# Patient Record
Sex: Male | Born: 1960 | Race: White | Hispanic: No | Marital: Single | Smoking: Never smoker
Health system: Southern US, Community
[De-identification: ages and names within clinical notes are randomized; demographics above are authoritative.]

## PROBLEM LIST (undated history)

## (undated) DIAGNOSIS — F840 Autistic disorder: Secondary | ICD-10-CM

## (undated) DIAGNOSIS — N189 Chronic kidney disease, unspecified: Secondary | ICD-10-CM

## (undated) HISTORY — PX: OTHER SURGICAL HISTORY: SHX169

---

## 1998-09-25 ENCOUNTER — Emergency Department (HOSPITAL_COMMUNITY): Admission: EM | Admit: 1998-09-25 | Discharge: 1998-09-25 | Payer: Self-pay | Admitting: Emergency Medicine

## 2003-11-27 ENCOUNTER — Emergency Department (HOSPITAL_COMMUNITY): Admission: EM | Admit: 2003-11-27 | Discharge: 2003-11-27 | Payer: Self-pay | Admitting: Emergency Medicine

## 2003-11-28 ENCOUNTER — Emergency Department (HOSPITAL_COMMUNITY): Admission: EM | Admit: 2003-11-28 | Discharge: 2003-11-28 | Payer: Self-pay | Admitting: Emergency Medicine

## 2011-07-11 ENCOUNTER — Ambulatory Visit (HOSPITAL_COMMUNITY)
Admission: EM | Admit: 2011-07-11 | Discharge: 2011-07-12 | Disposition: A | Discharge status: Home / Self Care | Payer: Medicare Other | Attending: Urology | Admitting: Urology

## 2011-07-11 ENCOUNTER — Encounter: Payer: Self-pay | Admitting: Emergency Medicine

## 2011-07-11 DIAGNOSIS — I10 Essential (primary) hypertension: Secondary | ICD-10-CM | POA: Insufficient documentation

## 2011-07-11 DIAGNOSIS — Z79899 Other long term (current) drug therapy: Secondary | ICD-10-CM | POA: Insufficient documentation

## 2011-07-11 DIAGNOSIS — N200 Calculus of kidney: Secondary | ICD-10-CM | POA: Insufficient documentation

## 2011-07-11 DIAGNOSIS — R109 Unspecified abdominal pain: Secondary | ICD-10-CM | POA: Insufficient documentation

## 2011-07-11 DIAGNOSIS — E669 Obesity, unspecified: Secondary | ICD-10-CM | POA: Insufficient documentation

## 2011-07-11 DIAGNOSIS — N201 Calculus of ureter: Secondary | ICD-10-CM | POA: Insufficient documentation

## 2011-07-11 DIAGNOSIS — E119 Type 2 diabetes mellitus without complications: Secondary | ICD-10-CM | POA: Insufficient documentation

## 2011-07-11 DIAGNOSIS — F840 Autistic disorder: Secondary | ICD-10-CM | POA: Insufficient documentation

## 2011-07-11 HISTORY — DX: Autistic disorder: F84.0

## 2011-07-11 HISTORY — DX: Chronic kidney disease, unspecified: N18.9

## 2011-07-11 NOTE — ED Notes (Signed)
Pt alert, presents with family, non verbal by hx, c/o right flank pain, emesis X 1 yesterday

## 2011-07-12 ENCOUNTER — Encounter (HOSPITAL_COMMUNITY): Payer: Self-pay | Admitting: Anesthesiology

## 2011-07-12 ENCOUNTER — Other Ambulatory Visit: Payer: Self-pay

## 2011-07-12 ENCOUNTER — Encounter (HOSPITAL_COMMUNITY)
Admission: EM | Disposition: A | Discharge status: Home / Self Care | Payer: Self-pay | Source: Home / Self Care | Attending: Emergency Medicine

## 2011-07-12 ENCOUNTER — Encounter (HOSPITAL_COMMUNITY): Payer: Self-pay | Admitting: Emergency Medicine

## 2011-07-12 ENCOUNTER — Observation Stay (HOSPITAL_COMMUNITY): Payer: Medicare Other | Admitting: Anesthesiology

## 2011-07-12 ENCOUNTER — Emergency Department (HOSPITAL_COMMUNITY): Payer: Medicare Other

## 2011-07-12 HISTORY — PX: CYSTOSCOPY W/ URETERAL STENT PLACEMENT: SHX1429

## 2011-07-12 LAB — SURGICAL PCR SCREEN
MRSA, PCR: NEGATIVE
Staphylococcus aureus: POSITIVE — AB

## 2011-07-12 LAB — URINALYSIS, ROUTINE W REFLEX MICROSCOPIC
Bilirubin Urine: NEGATIVE
Glucose, UA: >1000 mg/dL — AB
Ketones, ur: 40 mg/dL — AB
Leukocytes, UA: NEGATIVE
Nitrite: NEGATIVE
Protein, ur: 100 mg/dL — AB
Specific Gravity, Urine: 1.025 (ref 1.005–1.030)
Urobilinogen, UA: 0.2 mg/dL (ref 0.0–1.0)
pH: 5.5 (ref 5.0–8.0)

## 2011-07-12 LAB — URINE MICROSCOPIC-ADD ON

## 2011-07-12 LAB — DIFFERENTIAL
Basophils Absolute: 0 10*3/uL (ref 0.0–0.1)
Basophils Relative: 0 % (ref 0–1)
Eosinophils Absolute: 0 10*3/uL (ref 0.0–0.7)
Eosinophils Relative: 0 % (ref 0–5)
Lymphocytes Relative: 17 % (ref 12–46)
Lymphs Abs: 2.7 10*3/uL (ref 0.7–4.0)
Monocytes Absolute: 1.3 10*3/uL — ABNORMAL HIGH (ref 0.1–1.0)
Monocytes Relative: 8 % (ref 3–12)
Neutro Abs: 12.1 10*3/uL — ABNORMAL HIGH (ref 1.7–7.7)
Neutrophils Relative %: 75 % (ref 43–77)

## 2011-07-12 LAB — POCT I-STAT, CHEM 8
BUN: 18 mg/dL (ref 6–23)
Calcium, Ion: 1.18 mmol/L (ref 1.12–1.32)
Chloride: 105 mEq/L (ref 96–112)
Creatinine, Ser: 1.5 mg/dL — ABNORMAL HIGH (ref 0.50–1.35)
Glucose, Bld: 183 mg/dL — ABNORMAL HIGH (ref 70–99)
HCT: 46 % (ref 39.0–52.0)
Hemoglobin: 15.6 g/dL (ref 13.0–17.0)
Potassium: 3.9 mEq/L (ref 3.5–5.1)
Sodium: 138 mEq/L (ref 135–145)
TCO2: 23 mmol/L (ref 0–100)

## 2011-07-12 LAB — CBC
HCT: 44.6 % (ref 39.0–52.0)
Hemoglobin: 15.1 g/dL (ref 13.0–17.0)
MCH: 29.5 pg (ref 26.0–34.0)
MCHC: 33.9 g/dL (ref 30.0–36.0)
MCV: 87.1 fL (ref 78.0–100.0)
Platelets: 335 10*3/uL (ref 150–400)
RBC: 5.12 MIL/uL (ref 4.22–5.81)
RDW: 13.8 % (ref 11.5–15.5)
WBC: 16.2 10*3/uL — ABNORMAL HIGH (ref 4.0–10.5)

## 2011-07-12 LAB — GLUCOSE, CAPILLARY
Glucose-Capillary: 169 mg/dL — ABNORMAL HIGH (ref 70–99)
Glucose-Capillary: 167 mg/dL — ABNORMAL HIGH (ref 70–99)
Glucose-Capillary: 183 mg/dL — ABNORMAL HIGH (ref 70–99)

## 2011-07-12 SURGERY — CYSTOSCOPY, WITH RETROGRADE PYELOGRAM AND URETERAL STENT INSERTION
Anesthesia: General | Site: Ureter | Laterality: Right | Wound class: Clean Contaminated

## 2011-07-12 MED ORDER — BELLADONNA ALKALOIDS-OPIUM 16.2-60 MG RE SUPP
RECTAL | Status: AC
  Filled 2011-07-12: qty 1

## 2011-07-12 MED ORDER — IOHEXOL 300 MG/ML  SOLN
INTRAMUSCULAR | Status: AC
  Filled 2011-07-12: qty 1

## 2011-07-12 MED ORDER — FENTANYL CITRATE 0.05 MG/ML IJ SOLN
50.0000 ug | Freq: Once | INTRAMUSCULAR | Status: AC
  Administered 2011-07-12: 50 ug via INTRAVENOUS
  Filled 2011-07-12: qty 2

## 2011-07-12 MED ORDER — ONDANSETRON HCL 4 MG/2ML IJ SOLN: 4.0000 mg | INTRAMUSCULAR | Status: DC | PRN

## 2011-07-12 MED ORDER — SODIUM CHLORIDE 0.45 % IV SOLN
INTRAVENOUS | Status: DC
  Administered 2011-07-12: 11:00:00 via INTRAVENOUS
  Administered 2011-07-12: 800 mL via INTRAVENOUS

## 2011-07-12 MED ORDER — ZOLPIDEM TARTRATE 5 MG PO TABS: 5.0000 mg | ORAL_TABLET | Freq: Every evening | ORAL | Status: DC | PRN

## 2011-07-12 MED ORDER — DIPHENHYDRAMINE HCL 12.5 MG/5ML PO ELIX: 12.5000 mg | ORAL_SOLUTION | Freq: Four times a day (QID) | ORAL | Status: DC | PRN

## 2011-07-12 MED ORDER — SODIUM CHLORIDE 0.9 % IR SOLN
Status: DC | PRN
  Administered 2011-07-12: 3000 mL

## 2011-07-12 MED ORDER — LIDOCAINE HCL (CARDIAC) 10 MG/ML IV SOLN
INTRAVENOUS | Status: DC | PRN
  Administered 2011-07-12: 75 mg via INTRAVENOUS

## 2011-07-12 MED ORDER — IOHEXOL 300 MG/ML  SOLN
INTRAMUSCULAR | Status: DC | PRN
  Administered 2011-07-12: 7 mL

## 2011-07-12 MED ORDER — KETOROLAC TROMETHAMINE 30 MG/ML IJ SOLN
30.0000 mg | Freq: Once | INTRAMUSCULAR | Status: AC
  Administered 2011-07-12: 30 mg via INTRAVENOUS
  Filled 2011-07-12: qty 1

## 2011-07-12 MED ORDER — BELLADONNA ALKALOIDS-OPIUM 16.2-60 MG RE SUPP
RECTAL | Status: DC | PRN
  Administered 2011-07-12: 1 via RECTAL

## 2011-07-12 MED ORDER — INSULIN ASPART 100 UNIT/ML ~~LOC~~ SOLN: 0.0000 [IU] | SUBCUTANEOUS | Status: DC

## 2011-07-12 MED ORDER — ONDANSETRON HCL 4 MG/2ML IJ SOLN
INTRAMUSCULAR | Status: DC | PRN
  Administered 2011-07-12: 4 mg via INTRAVENOUS

## 2011-07-12 MED ORDER — SODIUM CHLORIDE 0.9 % IV BOLUS (SEPSIS)
1000.0000 mL | Freq: Once | INTRAVENOUS | Status: AC
  Administered 2011-07-12: 1000 mL via INTRAVENOUS

## 2011-07-12 MED ORDER — PROPOFOL 10 MG/ML IV BOLUS
INTRAVENOUS | Status: DC | PRN
  Administered 2011-07-12: 200 mg via INTRAVENOUS

## 2011-07-12 MED ORDER — HYDROMORPHONE HCL PF 1 MG/ML IJ SOLN: 0.5000 mg | INTRAMUSCULAR | Status: DC | PRN

## 2011-07-12 MED ORDER — DIPHENHYDRAMINE HCL 50 MG/ML IJ SOLN: 12.5000 mg | Freq: Four times a day (QID) | INTRAMUSCULAR | Status: DC | PRN

## 2011-07-12 MED ORDER — MIDAZOLAM HCL 5 MG/5ML IJ SOLN
INTRAMUSCULAR | Status: DC | PRN
  Administered 2011-07-12: 2 mg via INTRAVENOUS

## 2011-07-12 MED ORDER — HYDROCODONE-ACETAMINOPHEN 5-300 MG PO TABS: 1.0000 | ORAL_TABLET | Freq: Four times a day (QID) | ORAL | Status: DC

## 2011-07-12 MED ORDER — INSULIN ASPART 100 UNIT/ML ~~LOC~~ SOLN
SUBCUTANEOUS | Status: AC
  Administered 2011-07-12: 2 [IU]
  Filled 2011-07-12: qty 3

## 2011-07-12 MED ORDER — ONDANSETRON HCL 4 MG PO TABS: 4.0000 mg | ORAL_TABLET | Freq: Three times a day (TID) | ORAL | Status: DC | PRN

## 2011-07-12 MED ORDER — ONDANSETRON HCL 4 MG/2ML IJ SOLN
4.0000 mg | Freq: Once | INTRAMUSCULAR | Status: AC
  Administered 2011-07-12: 4 mg via INTRAVENOUS
  Filled 2011-07-12: qty 2

## 2011-07-12 MED ORDER — CIPROFLOXACIN IN D5W 400 MG/200ML IV SOLN
400.0000 mg | INTRAVENOUS | Status: AC
  Administered 2011-07-12: 400 mg via INTRAVENOUS
  Filled 2011-07-12: qty 200

## 2011-07-12 MED ORDER — FENTANYL CITRATE 0.05 MG/ML IJ SOLN
INTRAMUSCULAR | Status: DC | PRN
  Administered 2011-07-12: 100 ug via INTRAVENOUS
  Administered 2011-07-12 (×3): 50 ug via INTRAVENOUS

## 2011-07-12 MED ORDER — 0.9 % SODIUM CHLORIDE (POUR BTL) OPTIME
TOPICAL | Status: DC | PRN
  Administered 2011-07-12: 1000 mL

## 2011-07-12 SURGICAL SUPPLY — 16 items
ADAPTER CATH URET PLST 4-6FR (CATHETERS) ×2 IMPLANT
BAG URO CATCHER STRL LF (DRAPE) ×2 IMPLANT
CATH INTERMIT  6FR 70CM (CATHETERS) ×2 IMPLANT
CLOTH BEACON ORANGE TIMEOUT ST (SAFETY) ×2 IMPLANT
DRAPE CAMERA CLOSED 9X96 (DRAPES) ×2 IMPLANT
GLOVE BIOGEL M STRL SZ7.5 (GLOVE) ×2 IMPLANT
GLOVE SURG SS PI 8.5 STRL IVOR (GLOVE) ×1
GLOVE SURG SS PI 8.5 STRL STRW (GLOVE) ×1 IMPLANT
GOWN STRL NON-REIN LRG LVL3 (GOWN DISPOSABLE) ×4 IMPLANT
GUIDEWIRE STR DUAL SENSOR (WIRE) ×2 IMPLANT
IV NS IRRIG 3000ML ARTHROMATIC (IV SOLUTION) ×2 IMPLANT
MANIFOLD NEPTUNE II (INSTRUMENTS) ×2 IMPLANT
NS IRRIG 1000ML POUR BTL (IV SOLUTION) ×2 IMPLANT
PACK CYSTO (CUSTOM PROCEDURE TRAY) ×2 IMPLANT
STENT CONTOUR 6FRX24X.038 (STENTS) ×2 IMPLANT
TUBING CONNECTING 10 (TUBING) ×2 IMPLANT

## 2011-07-12 NOTE — ED Notes (Signed)
Report called to Sheralyn Boatman, RN in short stay.  Pt. Transported to Short Stay via stretcher.  Stable.  Father at bedside.

## 2011-07-12 NOTE — Transfer of Care (Addendum)
Immediate Anesthesia Transfer of Care Note  Patient: Julian Reyes  Procedure(s) Performed:  CYSTOSCOPY WITH RETROGRADE PYELOGRAM/URETERAL STENT PLACEMENT  Patient Location: PACU  Anesthesia Type: General  Level of Consciousness: awake, alert , oriented and responds to stimulation  Airway & Oxygen Therapy: Patient Spontanous Breathing  Post-op Assessment: Report given to PACU RN and Post -op Vital signs reviewed and stable  Post vital signs: stable  Complications: No apparent anesthesia complications Pt autistic and trying to get u p to urinate , keeping monitors on and off O2 sat 97%

## 2011-07-12 NOTE — ED Notes (Signed)
CBG 167 

## 2011-07-12 NOTE — Op Note (Signed)
Preoperative diagnosis:  1. Right ureteral and renal calculus   Postoperative diagnosis:  1. Right ureteral and renal calculus   Procedure:  1. Cystoscopy 2. Right ureteral stent placement (6 x 24) 3. Right retrograde pyelography with interpretation   Surgeon: Moody Bruins. M.D.  Anesthesia: General  Complications: None  Intraoperative findings: Right retrograde pyelography demonstrates a filling defect in the proximal right ureter consistent with the patient's known stone as well as a filling defect in the right renal pelvis consistent with the patient's known 5 mm renal stone.  EBL: Minimal  Specimens: None  Indication: Julian Reyes is a 51 y.o. patient with a right ureteral stone. After reviewing the management options for treatment, he elected to proceed with the above surgical procedure(s). We have discussed the potential benefits and risks of the procedure, side effects of the proposed treatment, the likelihood of the patient achieving the goals of the procedure, and any potential problems that might occur during the procedure or recuperation. Informed consent has been obtained.  Description of procedure:  The patient was taken to the operating room and general anesthesia was induced.  The patient was placed in the dorsal lithotomy position, prepped and draped in the usual sterile fashion, and preoperative antibiotics were administered. A preoperative time-out was performed.   Cystourethroscopy was performed.  The patient's urethra was examined and was normal. The bladder was then systematically examined in its entirety. There was no evidence for any bladder tumors, stones, or other mucosal pathology.    Attention then turned to the right ureteral orifice and a ureteral catheter was used to intubate the ureteral orifice.  Omnipaque contrast was injected through the ureteral catheter and a retrograde pyelogram was performed with findings as dictated above.  A 0.38  sensor guidewire was then advanced up the right ureter into the renal pelvis under fluoroscopic guidance.  The wire was then backloaded through the cystoscope and a ureteral stent was advance over the wire using Seldinger technique.  The stent was positioned appropriately under fluoroscopic and cystoscopic guidance.  The wire was then removed with an adequate stent curl noted in the renal pelvis as well as in the bladder.  The bladder was then emptied and the procedure ended.  The patient appeared to tolerate the procedure well and without complications.  The patient was able to be awakened and transferred to the recovery unit in satisfactory condition.    Moody Bruins MD

## 2011-07-12 NOTE — ED Notes (Signed)
Father states pt has a hx of kidney infection and kidney stones

## 2011-07-12 NOTE — ED Notes (Signed)
Pt is complaining of right flank abdominal pain per his father. Father states pt wanted him to "rub his right side on his back." pt is in no apparent distress.

## 2011-07-12 NOTE — ED Notes (Signed)
Pt. Voided approx. 250 ml light yellow urine--no stones or sediment seen.

## 2011-07-12 NOTE — Progress Notes (Signed)
Patient received by wheelchair from PACU very agitated and removing IV dressing and clothing. RN went over discharge instruction with pts father. His mother is retired Engineer, civil (consulting) who use to work at [employer]. Family given urinal, urine strainer, and collection cup if they are able to obtain stone when pt urinates. Pt does not want RN to do another set of vital signs and swings out when RN tries to obtain them. Assisted with dressing and taken to front door. His father verbalizes understanding of instructions and new prescriptions. States his wife will know what to do.

## 2011-07-12 NOTE — Progress Notes (Signed)
11 am oriented to the room father with the patient,side rails  up x 2 Explained everything to patient and  Father no questions by father

## 2011-07-12 NOTE — Progress Notes (Signed)
1100 am called Dr. Vevelyn Royals office re; any additional order staff to have him call us back 1300 checked with Dr. Vevelyn Royals office again to have him call back after getting out of surgery

## 2011-07-12 NOTE — ED Provider Notes (Signed)
History     CSN: [id number]  Arrival date & time 07/11/11  2240   First MD Initiated Contact with Patient 07/12/11 0301      Chief Complaint  Patient presents with  . Flank Pain    RT side. Pt non-verbal.  autistic. father thinks may be kidney stone or infection.    (Consider location/radiation/quality/duration/timing/severity/associated sxs/prior treatment) Patient is a 51 y.o. male presenting with flank pain. The history is provided by a parent. The history is limited by the condition of the patient.  Flank Pain This is a recurrent problem. The current episode started 12 to 24 hours ago. Episode frequency: intermittently. The problem has not changed since onset.He has tried nothing for the symptoms. The treatment provided no relief.  Patient has a h/o of kidney stone.  Symptoms started Friday with dark urine on Friday then had n/v starting on 12/31.  Patient has not eaten in 3 days so was dry heaving.  Then on 1/2 began taking dad's hand and placing it on the right flank as if for him to rub to.    Past Medical History  Diagnosis Date  . Autism   . Renal disorder   . Diabetes mellitus     History reviewed. No pertinent past surgical history.  No family history on file.  History  Substance Use Topics  . Smoking status: Not on file  . Smokeless tobacco: Not on file  . Alcohol Use:       Review of Systems  Unable to perform ROS Genitourinary: Positive for flank pain.    Allergies  Penicillins  Home Medications   Current Outpatient Rx  Name Route Sig Dispense Refill  . AMLODIPINE BESYLATE 5 MG PO TABS Oral Take 5 mg by mouth daily.      . ATORVASTATIN CALCIUM 10 MG PO TABS Oral Take 10 mg by mouth daily.      Marland Kitchen PIOGLITAZONE HCL-METFORMIN HCL 15-500 MG PO TABS Oral Take 1 tablet by mouth 2 (two) times daily with a meal.      . SITAGLIPTIN PHOSPHATE 25 MG PO TABS Oral Take 25 mg by mouth daily.        BP 149/96  Pulse 68  Temp(Src) 98.7 F (37.1 C) (Oral)   Resp 20  Wt 240 lb (108.863 kg)  SpO2 97%  Physical Exam  Constitutional: He appears well-developed and well-nourished.  HENT:  Head: Normocephalic and atraumatic.  Mouth/Throat: Oropharynx is clear and moist. No oropharyngeal exudate.  Eyes: Conjunctivae are normal. Pupils are equal, round, and reactive to light.  Neck: Normal range of motion. Neck supple. No tracheal deviation present.  Cardiovascular: Normal rate and regular rhythm.   Pulmonary/Chest: Effort normal and breath sounds normal. He has no wheezes.  Abdominal: Soft. Bowel sounds are normal. There is no tenderness. There is no rebound and no guarding.  Musculoskeletal: Normal range of motion.  Neurological: He is alert.  Skin: Skin is warm and dry. He is not diaphoretic.  Psychiatric:       unable    ED Course  Procedures (including critical care time)  Labs Reviewed  URINALYSIS, ROUTINE W REFLEX MICROSCOPIC - Abnormal; Notable for the following:    Glucose, UA >1000 (*)    Hgb urine dipstick MODERATE (*)    Ketones, ur 40 (*)    Protein, ur 100 (*)    All other components within normal limits  URINE MICROSCOPIC-ADD ON - Abnormal; Notable for the following:    Bacteria, UA FEW (*)  All other components within normal limits  CBC - Abnormal; Notable for the following:    WBC 16.2 (*)    All other components within normal limits  DIFFERENTIAL - Abnormal; Notable for the following:    Neutro Abs 12.1 (*)    Monocytes Absolute 1.3 (*)    All other components within normal limits  GLUCOSE, CAPILLARY - Abnormal; Notable for the following:    Glucose-Capillary 169 (*)    All other components within normal limits  POCT I-STAT, CHEM 8 - Abnormal; Notable for the following:    Creatinine, Ser 1.50 (*)    Glucose, Bld 183 (*)    All other components within normal limits  POCT CBG MONITORING  I-STAT, CHEM 8   Ct Abdomen Pelvis Wo Contrast  07/12/2011  *RADIOLOGY REPORT*  Clinical Data: Right flank pain.  Nausea  and vomiting.  History of kidney infections and stones.  Red cells and white cells in urine.  CT ABDOMEN AND PELVIS WITHOUT CONTRAST  Technique:  Multidetector CT imaging of the abdomen and pelvis was performed following the standard protocol without intravenous contrast.  Comparison: 11/27/2003  Findings: The lung bases are clear.  10 mm stone in the right ureteropelvic junction with periureteral stranding, proximal pyelocaliectasis, and pararenal stranding consistent with moderate obstruction.  This is new since the previous study.  There is a second smaller stone in the right renal pelvis proximal to the obstructing stone and measuring about 5 mm diameter.  The distal right ureter is decompressed and no bladder stones are visualized. No pyelocaliectasis, ureterectasis, or stone on the left.  Low attenuation change throughout the liver consistent with fatty infiltration.  Slightly increased density in the gallbladder suggesting sludge.  No discrete stones visualized.  No gallbladder distension or wall thickening.  Spleen size is normal.  The pancreas, adrenal glands, abdominal aorta, retroperitoneal lymph nodes, stomach and small bowel are normal for unenhanced scan. No free fluid or free air in the abdomen.  Pelvis:  No free or loculated pelvic fluid collections.  Focal inflammatory stranding in the right pelvic fat appears to be extending down from the obstructing right ureteral stone and is probably related to inflammation or extravasation related to the stone. The bladder wall is not thickened.  No free or loculated pelvic fluid collections.  The appendix is normal.  No significant pelvic lymphadenopathy.  Degenerative disc disease at L4-5.  IMPRESSION: Moderately obstructing 10 mm stone in the right ureteropelvic junction.  There is a second 5 mm stone more proximally in the right renal pelvis.  Original Report Authenticated By: Marlon Pel, M.D.     No diagnosis found.    MDM  Call placed to  Dr. Laverle Patter who will see the patient        Audris Speaker K Estephan Gallardo-Rasch, MD 07/12/11 [phone]

## 2011-07-12 NOTE — Consult Note (Signed)
Urology Consult   Physician requesting consult: Dr. April Palumbo-Rasch  Reason for consult: Right ureteral stone  History of Present Illness: Julian Reyes is a 51 y.o. autistic, non-verbal, non-communicative male patient who is under the care of his father and lives at home with his father. Although he does not communicate in a formal way, he was able to let his father know that he was in significant pain with his right flank as the main location of his pain.  He had associated nausea/vomiting and anorexia, but no fever.  He has a history of stones and has passed his prior stone over 10 years ago.  He has never required surgical intervention for kidney stones.  He denies a history of voiding or storage urinary symptoms, hematuria, UTIs, STDs, GU malignancy/trauma/surgery.  Past Medical History  Diagnosis Date  . Autism   . Diabetes mellitus     History reviewed. No pertinent past surgical history.  Home Medications:  Medications Prior to Admission  Medication Dose Route Frequency Provider Last Rate Last Dose  . fentaNYL (SUBLIMAZE) injection 50 mcg  50 mcg Intravenous Once April K Palumbo-Rasch, MD   50 mcg at 07/12/11 0453  . ketorolac (TORADOL) 30 MG/ML injection 30 mg  30 mg Intravenous Once April K Palumbo-Rasch, MD   30 mg at 07/12/11 0450  . ondansetron (ZOFRAN) injection 4 mg  4 mg Intravenous Once April K Palumbo-Rasch, MD   4 mg at 07/12/11 0354  . sodium chloride 0.9 % bolus 1,000 mL  1,000 mL Intravenous Once April K Palumbo-Rasch, MD   1,000 mL at 07/12/11 0353   Medications Prior to Admission  Medication Sig Dispense Refill  . pioglitazone-metformin (ACTOPLUS MET) 15-500 MG per tablet Take 1 tablet by mouth 2 (two) times daily with a meal.          Current Hospital Medications: Scheduled Meds:   . fentaNYL  50 mcg Intravenous Once  . ketorolac  30 mg Intravenous Once  . ondansetron  4 mg Intravenous Once  . sodium chloride  1,000 mL Intravenous Once    Continuous Infusions:  PRN Meds:.  Allergies:  Allergies  Allergen Reactions  . Penicillins     History reviewed. No pertinent family history.  Social History:  does not have a smoking history on file. He does not have any smokeless tobacco history on file. His alcohol and drug histories not on file.  ROS: A complete review of systems was performed.  All systems are negative except for pertinent findings as noted.  Physical Exam:  Vital signs in last 24 hours: Temp:  [98 F (36.7 C)-98.7 F (37.1 C)] 98 F (36.7 C) (01/03 0352) Pulse Rate:  [68-69] 69  (01/03 0352) Resp:  [18-20] 18  (01/03 0352) BP: (133-149)/(75-96) 133/75 mmHg (01/03 0352) SpO2:  [97 %-100 %] 100 % (01/03 0352) Weight:  [108.863 kg (240 lb)] 240 lb (108.863 kg) (01/02 2320) General:  Alert and oriented, No acute distress HEENT: Normocephalic, atraumatic Neck: No JVD or lymphadenopathy Cardiovascular: Regular rate and rhythm Lungs: Clear bilaterally Abdomen: Soft, nontender, nondistended, no abdominal masses, obese Back: No CVA tenderness Extremities: No edema Neurologic: Grossly intact  Laboratory Data:  Results for orders placed during the hospital encounter of 07/11/11 (from the past 72 hour(s))  URINALYSIS, ROUTINE W REFLEX MICROSCOPIC     Status: Abnormal   Collection Time   07/11/11 11:25 PM      Component Value Range Comment   Color, Urine YELLOW  YELLOW  APPearance CLEAR  CLEAR     Specific Gravity, Urine 1.025  1.005 - 1.030     pH 5.5  5.0 - 8.0     Glucose, UA >1000 (*) NEGATIVE (mg/dL)    Hgb urine dipstick MODERATE (*) NEGATIVE     Bilirubin Urine NEGATIVE  NEGATIVE     Ketones, ur 40 (*) NEGATIVE (mg/dL)    Protein, ur 409 (*) NEGATIVE (mg/dL)    Urobilinogen, UA 0.2  0.0 - 1.0 (mg/dL)    Nitrite NEGATIVE  NEGATIVE     Leukocytes, UA NEGATIVE  NEGATIVE    URINE MICROSCOPIC-ADD ON     Status: Abnormal   Collection Time   07/11/11 11:25 PM      Component Value Range Comment    Squamous Epithelial / LPF RARE  RARE     WBC, UA 3-6  <3 (WBC/hpf)    RBC / HPF 3-6  <3 (RBC/hpf)    Bacteria, UA FEW (*) RARE     Urine-Other MUCOUS PRESENT     GLUCOSE, CAPILLARY     Status: Abnormal   Collection Time   07/12/11  2:58 AM      Component Value Range Comment   Glucose-Capillary 169 (*) 70 - 99 (mg/dL)    Comment 1 Documented in Chart      Comment 2 Notify RN     CBC     Status: Abnormal   Collection Time   07/12/11  3:10 AM      Component Value Range Comment   WBC 16.2 (*) 4.0 - 10.5 (K/uL)    RBC 5.12  4.22 - 5.81 (MIL/uL)    Hemoglobin 15.1  13.0 - 17.0 (g/dL)    HCT 81.1  91.4 - 78.2 (%)    MCV 87.1  78.0 - 100.0 (fL)    MCH 29.5  26.0 - 34.0 (pg)    MCHC 33.9  30.0 - 36.0 (g/dL)    RDW 95.6  21.3 - 08.6 (%)    Platelets 335  150 - 400 (K/uL)   DIFFERENTIAL     Status: Abnormal   Collection Time   07/12/11  3:10 AM      Component Value Range Comment   Neutrophils Relative 75  43 - 77 (%)    Neutro Abs 12.1 (*) 1.7 - 7.7 (K/uL)    Lymphocytes Relative 17  12 - 46 (%)    Lymphs Abs 2.7  0.7 - 4.0 (K/uL)    Monocytes Relative 8  3 - 12 (%)    Monocytes Absolute 1.3 (*) 0.1 - 1.0 (K/uL)    Eosinophils Relative 0  0 - 5 (%)    Eosinophils Absolute 0.0  0.0 - 0.7 (K/uL)    Basophils Relative 0  0 - 1 (%)    Basophils Absolute 0.0  0.0 - 0.1 (K/uL)   POCT I-STAT, CHEM 8     Status: Abnormal   Collection Time   07/12/11  3:22 AM      Component Value Range Comment   Sodium 138  135 - 145 (mEq/L)    Potassium 3.9  3.5 - 5.1 (mEq/L)    Chloride 105  96 - 112 (mEq/L)    BUN 18  6 - 23 (mg/dL)    Creatinine, Ser 5.78 (*) 0.50 - 1.35 (mg/dL)    Glucose, Bld 469 (*) 70 - 99 (mg/dL)    Calcium, Ion 6.29  1.12 - 1.32 (mmol/L)    TCO2 23  0 - 100 (  mmol/L)    Hemoglobin 15.6  13.0 - 17.0 (g/dL)    HCT 40.9  81.1 - 91.4 (%)    No results found for this or any previous visit (from the past 240 hour(s)). Creatinine:  Basename 07/12/11 0322  CREATININE 1.50*     Radiologic Imaging: Ct Abdomen Pelvis Wo Contrast  07/12/2011  *RADIOLOGY REPORT*  Clinical Data: Right flank pain.  Nausea and vomiting.  History of kidney infections and stones.  Red cells and white cells in urine.  CT ABDOMEN AND PELVIS WITHOUT CONTRAST  Technique:  Multidetector CT imaging of the abdomen and pelvis was performed following the standard protocol without intravenous contrast.  Comparison: 11/27/2003  Findings: The lung bases are clear.  10 mm stone in the right ureteropelvic junction with periureteral stranding, proximal pyelocaliectasis, and pararenal stranding consistent with moderate obstruction.  This is new since the previous study.  There is a second smaller stone in the right renal pelvis proximal to the obstructing stone and measuring about 5 mm diameter.  The distal right ureter is decompressed and no bladder stones are visualized. No pyelocaliectasis, ureterectasis, or stone on the left.  Low attenuation change throughout the liver consistent with fatty infiltration.  Slightly increased density in the gallbladder suggesting sludge.  No discrete stones visualized.  No gallbladder distension or wall thickening.  Spleen size is normal.  The pancreas, adrenal glands, abdominal aorta, retroperitoneal lymph nodes, stomach and small bowel are normal for unenhanced scan. No free fluid or free air in the abdomen.  Pelvis:  No free or loculated pelvic fluid collections.  Focal inflammatory stranding in the right pelvic fat appears to be extending down from the obstructing right ureteral stone and is probably related to inflammation or extravasation related to the stone. The bladder wall is not thickened.  No free or loculated pelvic fluid collections.  The appendix is normal.  No significant pelvic lymphadenopathy.  Degenerative disc disease at L4-5.  IMPRESSION: Moderately obstructing 10 mm stone in the right ureteropelvic junction.  There is a second 5 mm stone more proximally in the right  renal pelvis.  Original Report Authenticated By: Marlon Pel, M.D.    I independently reviewed the above imaging studies.  Impression/Assessment:  Right proximal ureteral stone (10 mm) and right renal calculus (5 mm)  Plan:  Due to the fact that his pain cannot be well assessed, I discussed options with the patient's father who is his primary caretaker and medical POA. We have reviewed options including PCNL, ureteroscopic therapy, and ESWL as options considering the size of stone and unlikely possibility of spontaneous stone passage.  Considering the inability to assess his pain, we have decided to proceed with intervention today and he will undergo cystoscopy and stent placement to relieve any significant pain.  I think he will be best served with ureteroscopic laser lithotripsy for definitive therapy considering that he likely will not do well with sedation with ESWL since he won't be able to communicate his pain and his stone is not large enough to warrant a more invasive PCNL.  His father is agreeable to proceed with a stent today.  I discussed the potential benefits and risks of the procedure, side effects of the proposed treatment, the likelihood of the patient achieving the goals of the procedure, and any potential problems that might occur during the procedure or recuperation.    Aidric Endicott,LES 07/12/2011, 6:17 AM    Moody Bruins MD

## 2011-07-12 NOTE — Anesthesia Preprocedure Evaluation (Signed)
Anesthesia Evaluation  Patient identified by MRN, date of birth, ID band Patient awake    Reviewed: Allergy & Precautions, H&P , NPO status , Patient's Chart, lab work & pertinent test results  Airway Mallampati: II TM Distance: >3 FB Neck ROM: Full    Dental No notable dental hx.    Pulmonary neg pulmonary ROS,  clear to auscultation  Pulmonary exam normal       Cardiovascular neg cardio ROS Regular Normal    Neuro/Psych PSYCHIATRIC DISORDERS Autism. Does not speak. Father with patient.Negative Neurological ROS  Negative Psych ROS   GI/Hepatic negative GI ROS, Neg liver ROS,   Endo/Other  Negative Endocrine ROSDiabetes mellitus-, Type 2, Oral Hypoglycemic Agents  Renal/GU negative Renal ROS  Genitourinary negative   Musculoskeletal negative musculoskeletal ROS (+)   Abdominal (+) obese,   Peds negative pediatric ROS (+)  Hematology negative hematology ROS (+)   Anesthesia Other Findings   Reproductive/Obstetrics negative OB ROS                           Anesthesia Physical Anesthesia Plan  ASA: III  Anesthesia Plan: General   Post-op Pain Management:    Induction: Intravenous  Airway Management Planned: LMA  Additional Equipment:   Intra-op Plan:   Post-operative Plan: Extubation in OR  Informed Consent: I have reviewed the patients History and Physical, chart, labs and discussed the procedure including the risks, benefits and alternatives for the proposed anesthesia with the patient or authorized representative who has indicated his/her understanding and acceptance.   Dental advisory given  Plan Discussed with: CRNA  Anesthesia Plan Comments:         Anesthesia Quick Evaluation

## 2011-07-12 NOTE — ED Notes (Signed)
Report received from p.m. Shift--Pt. Sitting on carrier--father was  at bedside but has- left for few minutes to get something to eat--Pt. Does not appear to be in any distress---breakfast tray served---tried assisting pt. With food tray but, he pushed the spoon and entire tray away----shaking his head to indicate "no."  IV patent

## 2011-07-12 NOTE — Discharge Summary (Signed)
Date of admission: 07/11/2011  Date of discharge: 07/12/2011  Admission diagnosis: Right ureteral stone  Discharge diagnosis: Same  Secondary diagnoses: Diabetes and HTN  History and Physical: For full details, please see admission history and physical. Briefly, Julian Reyes is a 51 y.o. year old patient with a right ureteral stone.   Hospital Course: He presented to the ED with pain and was found to have a 10 mm proximal right ureteral stone and a 5 mm renal pelvis.  We discussed options and the patient and his father (his medical POA) decided to proceed with a right ureteral stent for pain relief and to proceed with ureteroscopic laser lithotripsy at a later date for definitive treatment.  Laboratory values:  Basename 07/12/11 0322 07/12/11 0310  HGB 15.6 15.1  HCT 46.0 44.6    Basename 07/12/11 0322  CREATININE 1.50*    Disposition: Home  Discharge instruction: The patient was instructed to be ambulatory but told to refrain from heavy lifting, strenuous activity, or driving.    Discharge medications:  Medication List  As of 07/12/2011  3:15 PM   START taking these medications         Hydrocodone-Acetaminophen 5-300 MG Tabs   Take 1-2 tablets by mouth every 6 (six) hours.      ondansetron 4 MG tablet   Commonly known as: ZOFRAN   Take 1 tablet (4 mg total) by mouth every 8 (eight) hours as needed for nausea.         CONTINUE taking these medications         amLODipine 5 MG tablet   Commonly known as: NORVASC      atorvastatin 10 MG tablet   Commonly known as: LIPITOR      pioglitazone-metformin 15-500 MG per tablet   Commonly known as: ACTOPLUS MET      sitaGLIPtin 25 MG tablet   Commonly known as: JANUVIA          Where to get your medications    These are the prescriptions that you need to pick up.   You may get these medications from any pharmacy.         Hydrocodone-Acetaminophen 5-300 MG Tabs   ondansetron 4 MG tablet            Followup:    Follow-up Information    Please follow up. (Will call to arrange follow up surgery.)

## 2011-07-12 NOTE — Anesthesia Postprocedure Evaluation (Signed)
  Anesthesia Post-op Note  Patient: Julian Reyes  Procedure(s) Performed:  CYSTOSCOPY WITH RETROGRADE PYELOGRAM/URETERAL STENT PLACEMENT  Patient Location: PACU  Anesthesia Type: General  Level of Consciousness: awake and alert   Airway and Oxygen Therapy: Patient Spontanous Breathing  Post-op Pain: mild  Post-op Assessment: Post-op Vital signs reviewed, Patient's Cardiovascular Status Stable, Respiratory Function Stable, Patent Airway and No signs of Nausea or vomiting  Post-op Vital Signs: stable  Complications: No apparent anesthesia complications

## 2011-07-12 NOTE — ED Notes (Signed)
Pt CBG is 169, RN made aware.

## 2011-07-13 ENCOUNTER — Other Ambulatory Visit: Payer: Self-pay | Admitting: Urology

## 2011-07-13 ENCOUNTER — Encounter (HOSPITAL_COMMUNITY): Payer: Self-pay

## 2011-07-13 MED FILL — Mupirocin Oint 2%: CUTANEOUS | Qty: 22 | Status: AC

## 2011-07-16 ENCOUNTER — Encounter (HOSPITAL_COMMUNITY): Payer: Self-pay | Admitting: Urology

## 2011-07-18 ENCOUNTER — Encounter (HOSPITAL_COMMUNITY): Payer: Self-pay

## 2011-07-18 ENCOUNTER — Encounter (HOSPITAL_COMMUNITY)
Admission: RE | Admit: 2011-07-18 | Discharge: 2011-07-18 | Disposition: A | Discharge status: Home / Self Care | Payer: Medicare Other | Source: Ambulatory Visit | Attending: Urology | Admitting: Urology

## 2011-07-18 DIAGNOSIS — N189 Chronic kidney disease, unspecified: Secondary | ICD-10-CM

## 2011-07-18 DIAGNOSIS — F840 Autistic disorder: Secondary | ICD-10-CM

## 2011-07-18 HISTORY — DX: Chronic kidney disease, unspecified: N18.9

## 2011-07-18 HISTORY — DX: Autistic disorder: F84.0

## 2011-07-18 LAB — BASIC METABOLIC PANEL WITH GFR
BUN: 17 mg/dL (ref 6–23)
CO2: 26 meq/L (ref 19–32)
Calcium: 9.5 mg/dL (ref 8.4–10.5)
Chloride: 100 meq/L (ref 96–112)
Creatinine, Ser: 1.13 mg/dL (ref 0.50–1.35)
GFR calc Af Amer: 85 mL/min — ABNORMAL LOW
GFR calc non Af Amer: 73 mL/min — ABNORMAL LOW
Glucose, Bld: 216 mg/dL — ABNORMAL HIGH (ref 70–99)
Potassium: 3.9 meq/L (ref 3.5–5.1)
Sodium: 137 meq/L (ref 135–145)

## 2011-07-18 LAB — SURGICAL PCR SCREEN
MRSA, PCR: NEGATIVE
Staphylococcus aureus: NEGATIVE

## 2011-07-18 NOTE — Patient Instructions (Signed)
[address]  07/18/2011   Your procedure is scheduled on: 07-19-11  Report to [facility] at 0900 AM.  Call this number if you have problems the morning of surgery: [phone]   Remember:   Do not eat food:After Midnight.  May have clear liquids:until Midnight .  Clear liquids include soda, tea, black coffee, apple or grape juice, broth.  Take these medicines the morning of surgery with A SIP OF WATER: Amlodipine, Atorvastatin   Do not wear jewelry, make-up or nail polish.  Do not wear lotions, powders, or perfumes. You may wear deodorant.  Do not shave 48 hours prior to surgery.  Do not bring valuables to the hospital.  Contacts, dentures or bridgework may not be worn into surgery.  Leave suitcase in the car. After surgery it may be brought to your room.  For patients admitted to the hospital, checkout time is 11:00 AM the day of discharge.   Patients discharged the day of surgery will not be allowed to drive home.  Name and phone number of your driver: father  Special Instructions: CHG Shower Use Special Wash: 1/2 bottle night before surgery and 1/2 bottle morning of surgery.   Please read over the following fact sheets that you were given: MRSA Information

## 2011-07-18 NOTE — Pre-Procedure Instructions (Signed)
07-18-11 Chart to Short Stay -PCR screen pending at 1650.

## 2011-07-18 NOTE — H&P (Signed)
Urology Consult  Physician requesting consult: Dr. April Palumbo-Rasch  Reason for consult: Right ureteral stone  History of Present Illness: Julian Reyes is a 51 y.o. autistic, non-verbal, non-communicative male patient who is under the care of his father and lives at home with his father. Although he does not communicate in a formal way, he was able to let his father know that he was in significant pain with his right flank as the main location of his pain. He had associated nausea/vomiting and anorexia, but no fever. He has a history of stones and has passed his prior stone over 10 years ago. He has never required surgical intervention for kidney stones.  He denies a history of voiding or storage urinary symptoms, hematuria, UTIs, STDs, GU malignancy/trauma/surgery.  Past Medical History   Diagnosis  Date   .  Autism    .  Diabetes mellitus     History reviewed. No pertinent past surgical history.  Home Medications:  Medications Prior to Admission   Medication  Dose  Route  Frequency  Provider  Last Rate  Last Dose   .  fentaNYL (SUBLIMAZE) injection 50 mcg  50 mcg  Intravenous  Once  April K Palumbo-Rasch, MD   50 mcg at 07/12/11 0453   .  ketorolac (TORADOL) 30 MG/ML injection 30 mg  30 mg  Intravenous  Once  April K Palumbo-Rasch, MD   30 mg at 07/12/11 0450   .  ondansetron (ZOFRAN) injection 4 mg  4 mg  Intravenous  Once  April K Palumbo-Rasch, MD   4 mg at 07/12/11 0354   .  sodium chloride 0.9 % bolus 1,000 mL  1,000 mL  Intravenous  Once  April K Palumbo-Rasch, MD   1,000 mL at 07/12/11 0353    Medications Prior to Admission   Medication  Sig  Dispense  Refill   .  pioglitazone-metformin (ACTOPLUS MET) 15-500 MG per tablet  Take 1 tablet by mouth 2 (two) times daily with a meal.      Current Hospital Medications:  Scheduled Meds:  .  fentaNYL  50 mcg  Intravenous  Once   .  ketorolac  30 mg  Intravenous  Once   .  ondansetron  4 mg  Intravenous  Once   .  sodium chloride   1,000 mL  Intravenous  Once    Continuous Infusions:  PRN Meds:.  Allergies:  Allergies   Allergen  Reactions   .  Penicillins     History reviewed. No pertinent family history.  Social History: does not have a smoking history on file. He does not have any smokeless tobacco history on file. His alcohol and drug histories not on file.  ROS:  A complete review of systems was performed. All systems are negative except for pertinent findings as noted.  Physical Exam:  Vital signs in last 24 hours:  Temp: [98 F (36.7 C)-98.7 F (37.1 C)] 98 F (36.7 C) (01/03 0352)  Pulse Rate: [68-69] 69 (01/03 0352)  Resp: [18-20] 18 (01/03 0352)  BP: (133-149)/(75-96) 133/75 mmHg (01/03 0352)  SpO2: [97 %-100 %] 100 % (01/03 0352)  Weight: [108.863 kg (240 lb)] 240 lb (108.863 kg) (01/02 2320)  General: Alert and oriented, No acute distress  HEENT: Normocephalic, atraumatic  Neck: No JVD or lymphadenopathy  Cardiovascular: Regular rate and rhythm  Lungs: Clear bilaterally  Abdomen: Soft, nontender, nondistended, no abdominal masses, obese  Back: No CVA tenderness  Extremities: No edema  Neurologic:  Grossly intact  Laboratory Data:  Results for orders placed during the hospital encounter of 07/11/11 (from the past 72 hour(s))   URINALYSIS, ROUTINE W REFLEX MICROSCOPIC Status: Abnormal    Collection Time    07/11/11 11:25 PM   Component  Value  Range  Comment    Color, Urine  YELLOW  YELLOW     APPearance  CLEAR  CLEAR     Specific Gravity, Urine  1.025  1.005 - 1.030     pH  5.5  5.0 - 8.0     Glucose, UA  >1000 (*)  NEGATIVE (mg/dL)     Hgb urine dipstick  MODERATE (*)  NEGATIVE     Bilirubin Urine  NEGATIVE  NEGATIVE     Ketones, ur  40 (*)  NEGATIVE (mg/dL)     Protein, ur  161 (*)  NEGATIVE (mg/dL)     Urobilinogen, UA  0.2  0.0 - 1.0 (mg/dL)     Nitrite  NEGATIVE  NEGATIVE     Leukocytes, UA  NEGATIVE  NEGATIVE    URINE MICROSCOPIC-ADD ON Status: Abnormal    Collection Time     07/11/11 11:25 PM   Component  Value  Range  Comment    Squamous Epithelial / LPF  RARE  RARE     WBC, UA  3-6  <3 (WBC/hpf)     RBC / HPF  3-6  <3 (RBC/hpf)     Bacteria, UA  FEW (*)  RARE     Urine-Other  MUCOUS PRESENT     GLUCOSE, CAPILLARY Status: Abnormal    Collection Time    07/12/11 2:58 AM   Component  Value  Range  Comment    Glucose-Capillary  169 (*)  70 - 99 (mg/dL)     Comment 1  Documented in Chart      Comment 2  Notify RN     CBC Status: Abnormal    Collection Time    07/12/11 3:10 AM   Component  Value  Range  Comment    WBC  16.2 (*)  4.0 - 10.5 (K/uL)     RBC  5.12  4.22 - 5.81 (MIL/uL)     Hemoglobin  15.1  13.0 - 17.0 (g/dL)     HCT  09.6  04.5 - 52.0 (%)     MCV  87.1  78.0 - 100.0 (fL)     MCH  29.5  26.0 - 34.0 (pg)     MCHC  33.9  30.0 - 36.0 (g/dL)     RDW  40.9  81.1 - 15.5 (%)     Platelets  335  150 - 400 (K/uL)    DIFFERENTIAL Status: Abnormal    Collection Time    07/12/11 3:10 AM   Component  Value  Range  Comment    Neutrophils Relative  75  43 - 77 (%)     Neutro Abs  12.1 (*)  1.7 - 7.7 (K/uL)     Lymphocytes Relative  17  12 - 46 (%)     Lymphs Abs  2.7  0.7 - 4.0 (K/uL)     Monocytes Relative  8  3 - 12 (%)     Monocytes Absolute  1.3 (*)  0.1 - 1.0 (K/uL)     Eosinophils Relative  0  0 - 5 (%)     Eosinophils Absolute  0.0  0.0 - 0.7 (K/uL)     Basophils Relative  0  0 -  1 (%)     Basophils Absolute  0.0  0.0 - 0.1 (K/uL)    POCT I-STAT, CHEM 8 Status: Abnormal    Collection Time    07/12/11 3:22 AM   Component  Value  Range  Comment    Sodium  138  135 - 145 (mEq/L)     Potassium  3.9  3.5 - 5.1 (mEq/L)     Chloride  105  96 - 112 (mEq/L)     BUN  18  6 - 23 (mg/dL)     Creatinine, Ser  1.61 (*)  0.50 - 1.35 (mg/dL)     Glucose, Bld  096 (*)  70 - 99 (mg/dL)     Calcium, Ion  0.45  1.12 - 1.32 (mmol/L)     TCO2  23  0 - 100 (mmol/L)     Hemoglobin  15.6  13.0 - 17.0 (g/dL)     HCT  40.9  81.1 - 52.0 (%)     No results found for  this or any previous visit (from the past 240 hour(s)).  Creatinine:   Basename  07/12/11 0322   CREATININE  1.50*    Radiologic Imaging:  Ct Abdomen Pelvis Wo Contrast  07/12/2011 *RADIOLOGY REPORT* Clinical Data: Right flank pain. Nausea and vomiting. History of kidney infections and stones. Red cells and white cells in urine. CT ABDOMEN AND PELVIS WITHOUT CONTRAST Technique: Multidetector CT imaging of the abdomen and pelvis was performed following the standard protocol without intravenous contrast. Comparison: 11/27/2003 Findings: The lung bases are clear. 10 mm stone in the right ureteropelvic junction with periureteral stranding, proximal pyelocaliectasis, and pararenal stranding consistent with moderate obstruction. This is new since the previous study. There is a second smaller stone in the right renal pelvis proximal to the obstructing stone and measuring about 5 mm diameter. The distal right ureter is decompressed and no bladder stones are visualized. No pyelocaliectasis, ureterectasis, or stone on the left. Low attenuation change throughout the liver consistent with fatty infiltration. Slightly increased density in the gallbladder suggesting sludge. No discrete stones visualized. No gallbladder distension or wall thickening. Spleen size is normal. The pancreas, adrenal glands, abdominal aorta, retroperitoneal lymph nodes, stomach and small bowel are normal for unenhanced scan. No free fluid or free air in the abdomen. Pelvis: No free or loculated pelvic fluid collections. Focal inflammatory stranding in the right pelvic fat appears to be extending down from the obstructing right ureteral stone and is probably related to inflammation or extravasation related to the stone. The bladder wall is not thickened. No free or loculated pelvic fluid collections. The appendix is normal. No significant pelvic lymphadenopathy. Degenerative disc disease at L4-5. IMPRESSION: Moderately obstructing 10 mm stone in  the right ureteropelvic junction. There is a second 5 mm stone more proximally in the right renal pelvis. Original Report Authenticated By: Marlon Pel, M.D.   I independently reviewed the above imaging studies.  Impression/Assessment:  Right proximal ureteral stone (10 mm) and right renal calculus (5 mm)  Plan:  Due to the fact that his pain cannot be well assessed, I discussed options with the patient's father who is his primary caretaker and medical POA. We have reviewed options including PCNL, ureteroscopic therapy, and ESWL as options considering the size of stone and unlikely possibility of spontaneous stone passage. Considering the inability to assess his pain, we have decided to proceed with intervention today and he will undergo cystoscopy and stent placement to relieve any significant pain.  I think he will be best served with ureteroscopic laser lithotripsy for definitive therapy considering that he likely will not do well with sedation with ESWL since he won't be able to communicate his pain and his stone is not large enough to warrant a more invasive PCNL. His father is agreeable to proceed with a stent today.  I discussed the potential benefits and risks of the procedure, side effects of the proposed treatment, the likelihood of the patient achieving the goals of the procedure, and any potential problems that might occur during the procedure or recuperation.  Breanda Greenlaw,LES  07/12/2011, 6:17 AM  Rolly Salter, Montez Hageman. MD  Patient underwent ureteral stent placement last week.  He follows up today to undergo definitive ureteroscopic laser lithotripsy of his right ureteral and right renal stones.

## 2011-07-18 NOTE — Pre-Procedure Instructions (Addendum)
07-18-11 Pt. Is Autistic, doesn't speak, makes verbal noises only.Occ. Get agitated, but mostly cooperative. Father Ramces Shomaker is guardian,LPOA 07-18-11 CT ABD. With chest view (07-12-11) with chart-will be used.

## 2011-07-19 ENCOUNTER — Ambulatory Visit (HOSPITAL_COMMUNITY)
Admission: RE | Admit: 2011-07-19 | Discharge: 2011-07-19 | Disposition: A | Discharge status: Home / Self Care | Payer: Medicare Other | Source: Ambulatory Visit | Attending: Urology | Admitting: Urology

## 2011-07-19 ENCOUNTER — Encounter (HOSPITAL_COMMUNITY): Payer: Self-pay | Admitting: Anesthesiology

## 2011-07-19 ENCOUNTER — Encounter (HOSPITAL_COMMUNITY): Payer: Self-pay | Admitting: *Deleted

## 2011-07-19 ENCOUNTER — Ambulatory Visit (HOSPITAL_COMMUNITY): Payer: Medicare Other | Admitting: Anesthesiology

## 2011-07-19 ENCOUNTER — Encounter (HOSPITAL_COMMUNITY)
Admission: RE | Disposition: A | Discharge status: Home / Self Care | Payer: Self-pay | Source: Ambulatory Visit | Attending: Urology

## 2011-07-19 DIAGNOSIS — M5137 Other intervertebral disc degeneration, lumbosacral region: Secondary | ICD-10-CM | POA: Insufficient documentation

## 2011-07-19 DIAGNOSIS — M51379 Other intervertebral disc degeneration, lumbosacral region without mention of lumbar back pain or lower extremity pain: Secondary | ICD-10-CM | POA: Insufficient documentation

## 2011-07-19 DIAGNOSIS — Z79899 Other long term (current) drug therapy: Secondary | ICD-10-CM | POA: Insufficient documentation

## 2011-07-19 DIAGNOSIS — Z01812 Encounter for preprocedural laboratory examination: Secondary | ICD-10-CM | POA: Insufficient documentation

## 2011-07-19 DIAGNOSIS — N200 Calculus of kidney: Secondary | ICD-10-CM | POA: Insufficient documentation

## 2011-07-19 DIAGNOSIS — E119 Type 2 diabetes mellitus without complications: Secondary | ICD-10-CM | POA: Insufficient documentation

## 2011-07-19 DIAGNOSIS — R112 Nausea with vomiting, unspecified: Secondary | ICD-10-CM | POA: Insufficient documentation

## 2011-07-19 DIAGNOSIS — N201 Calculus of ureter: Secondary | ICD-10-CM | POA: Insufficient documentation

## 2011-07-19 HISTORY — PX: CYSTOSCOPY/RETROGRADE/URETEROSCOPY/STONE EXTRACTION WITH BASKET: SHX5317

## 2011-07-19 LAB — GLUCOSE, CAPILLARY
Glucose-Capillary: 171 mg/dL — ABNORMAL HIGH (ref 70–99)
Glucose-Capillary: 156 mg/dL — ABNORMAL HIGH (ref 70–99)

## 2011-07-19 SURGERY — CYSTOSCOPY, WITH CALCULUS REMOVAL USING BASKET
Anesthesia: General | Laterality: Right | Wound class: Clean Contaminated

## 2011-07-19 MED ORDER — PROPOFOL 10 MG/ML IV BOLUS
INTRAVENOUS | Status: DC | PRN
  Administered 2011-07-19: 150 mg via INTRAVENOUS
  Administered 2011-07-19: 20 mg via INTRAVENOUS

## 2011-07-19 MED ORDER — MIDAZOLAM HCL 5 MG/5ML IJ SOLN
INTRAMUSCULAR | Status: DC | PRN
  Administered 2011-07-19: 2 mg via INTRAVENOUS

## 2011-07-19 MED ORDER — ONDANSETRON HCL 4 MG/2ML IJ SOLN
INTRAMUSCULAR | Status: DC | PRN
  Administered 2011-07-19: 4 mg via INTRAVENOUS

## 2011-07-19 MED ORDER — CIPROFLOXACIN IN D5W 400 MG/200ML IV SOLN
INTRAVENOUS | Status: AC
  Filled 2011-07-19: qty 200

## 2011-07-19 MED ORDER — LACTATED RINGERS IV SOLN
INTRAVENOUS | Status: DC
  Administered 2011-07-19: 1000 mL via INTRAVENOUS

## 2011-07-19 MED ORDER — FENTANYL CITRATE 0.05 MG/ML IJ SOLN: 25.0000 ug | INTRAMUSCULAR | Status: DC | PRN

## 2011-07-19 MED ORDER — PROMETHAZINE HCL 25 MG/ML IJ SOLN: 6.2500 mg | INTRAMUSCULAR | Status: DC | PRN

## 2011-07-19 MED ORDER — IOHEXOL 300 MG/ML  SOLN
INTRAMUSCULAR | Status: AC
  Filled 2011-07-19: qty 1

## 2011-07-19 MED ORDER — SODIUM CHLORIDE 0.9 % IR SOLN
Status: DC | PRN
  Administered 2011-07-19: 3000 mL

## 2011-07-19 MED ORDER — FENTANYL CITRATE 0.05 MG/ML IJ SOLN
INTRAMUSCULAR | Status: DC | PRN
  Administered 2011-07-19: 100 ug via INTRAVENOUS
  Administered 2011-07-19 (×2): 50 ug via INTRAVENOUS
  Administered 2011-07-19: 100 ug via INTRAVENOUS
  Administered 2011-07-19: 50 ug via INTRAVENOUS

## 2011-07-19 MED ORDER — IOHEXOL 300 MG/ML  SOLN
INTRAMUSCULAR | Status: DC | PRN
  Administered 2011-07-19: 10 mL

## 2011-07-19 MED ORDER — CIPROFLOXACIN IN D5W 400 MG/200ML IV SOLN
400.0000 mg | INTRAVENOUS | Status: AC
  Administered 2011-07-19: 400 mg via INTRAVENOUS

## 2011-07-19 MED ORDER — CIPROFLOXACIN HCL 500 MG PO TABS: 500.0000 mg | ORAL_TABLET | Freq: Two times a day (BID) | ORAL | Status: AC

## 2011-07-19 SURGICAL SUPPLY — 15 items
ADAPTER CATH URET PLST 4-6FR (CATHETERS) IMPLANT
BAG URO CATCHER STRL LF (DRAPE) ×2 IMPLANT
BASKET ZERO TIP NITINOL 2.4FR (BASKET) ×2 IMPLANT
CATH INTERMIT  6FR 70CM (CATHETERS) ×2 IMPLANT
CLOTH BEACON ORANGE TIMEOUT ST (SAFETY) ×2 IMPLANT
DRAPE CAMERA CLOSED 9X96 (DRAPES) ×2 IMPLANT
GLOVE BIOGEL M STRL SZ7.5 (GLOVE) ×2 IMPLANT
GOWN PREVENTION PLUS XLARGE (GOWN DISPOSABLE) ×2 IMPLANT
GOWN STRL NON-REIN LRG LVL3 (GOWN DISPOSABLE) ×4 IMPLANT
GUIDEWIRE ANG ZIPWIRE 038X150 (WIRE) IMPLANT
GUIDEWIRE STR DUAL SENSOR (WIRE) ×2 IMPLANT
MANIFOLD NEPTUNE II (INSTRUMENTS) ×2 IMPLANT
PACK CYSTO (CUSTOM PROCEDURE TRAY) ×2 IMPLANT
SHEATH ACCESS URETERAL 38CM (SHEATH) ×2 IMPLANT
TUBING CONNECTING 10 (TUBING) ×2 IMPLANT

## 2011-07-19 NOTE — H&P (View-Only) (Signed)
Urology Consult   Physician requesting consult: Dr. April Palumbo-Rasch  Reason for consult: Right ureteral stone  History of Present Illness: Julian Reyes is a 50 y.o. autistic, non-verbal, non-communicative male patient who is under the care of his father and lives at home with his father. Although he does not communicate in a formal way, he was able to let his father know that he was in significant pain with his right flank as the main location of his pain.  He had associated nausea/vomiting and anorexia, but no fever.  He has a history of stones and has passed his prior stone over 10 years ago.  He has never required surgical intervention for kidney stones.  He denies a history of voiding or storage urinary symptoms, hematuria, UTIs, STDs, GU malignancy/trauma/surgery.  Past Medical History  Diagnosis Date  . Autism   . Diabetes mellitus     History reviewed. No pertinent past surgical history.  Home Medications:  Medications Prior to Admission  Medication Dose Route Frequency Provider Last Rate Last Dose  . fentaNYL (SUBLIMAZE) injection 50 mcg  50 mcg Intravenous Once April K Palumbo-Rasch, MD   50 mcg at 07/12/11 0453  . ketorolac (TORADOL) 30 MG/ML injection 30 mg  30 mg Intravenous Once April K Palumbo-Rasch, MD   30 mg at 07/12/11 0450  . ondansetron (ZOFRAN) injection 4 mg  4 mg Intravenous Once April K Palumbo-Rasch, MD   4 mg at 07/12/11 0354  . sodium chloride 0.9 % bolus 1,000 mL  1,000 mL Intravenous Once April K Palumbo-Rasch, MD   1,000 mL at 07/12/11 0353   Medications Prior to Admission  Medication Sig Dispense Refill  . pioglitazone-metformin (ACTOPLUS MET) 15-500 MG per tablet Take 1 tablet by mouth 2 (two) times daily with a meal.          Current Hospital Medications: Scheduled Meds:   . fentaNYL  50 mcg Intravenous Once  . ketorolac  30 mg Intravenous Once  . ondansetron  4 mg Intravenous Once  . sodium chloride  1,000 mL Intravenous Once    Continuous Infusions:  PRN Meds:.  Allergies:  Allergies  Allergen Reactions  . Penicillins     History reviewed. No pertinent family history.  Social History:  does not have a smoking history on file. He does not have any smokeless tobacco history on file. His alcohol and drug histories not on file.  ROS: A complete review of systems was performed.  All systems are negative except for pertinent findings as noted.  Physical Exam:  Vital signs in last 24 hours: Temp:  [98 F (36.7 C)-98.7 F (37.1 C)] 98 F (36.7 C) (01/03 0352) Pulse Rate:  [68-69] 69  (01/03 0352) Resp:  [18-20] 18  (01/03 0352) BP: (133-149)/(75-96) 133/75 mmHg (01/03 0352) SpO2:  [97 %-100 %] 100 % (01/03 0352) Weight:  [108.863 kg (240 lb)] 240 lb (108.863 kg) (01/02 2320) General:  Alert and oriented, No acute distress HEENT: Normocephalic, atraumatic Neck: No JVD or lymphadenopathy Cardiovascular: Regular rate and rhythm Lungs: Clear bilaterally Abdomen: Soft, nontender, nondistended, no abdominal masses, obese Back: No CVA tenderness Extremities: No edema Neurologic: Grossly intact  Laboratory Data:  Results for orders placed during the hospital encounter of 07/11/11 (from the past 72 hour(s))  URINALYSIS, ROUTINE W REFLEX MICROSCOPIC     Status: Abnormal   Collection Time   07/11/11 11:25 PM      Component Value Range Comment   Color, Urine YELLOW  YELLOW       APPearance CLEAR  CLEAR     Specific Gravity, Urine 1.025  1.005 - 1.030     pH 5.5  5.0 - 8.0     Glucose, UA >1000 (*) NEGATIVE (mg/dL)    Hgb urine dipstick MODERATE (*) NEGATIVE     Bilirubin Urine NEGATIVE  NEGATIVE     Ketones, ur 40 (*) NEGATIVE (mg/dL)    Protein, ur 100 (*) NEGATIVE (mg/dL)    Urobilinogen, UA 0.2  0.0 - 1.0 (mg/dL)    Nitrite NEGATIVE  NEGATIVE     Leukocytes, UA NEGATIVE  NEGATIVE    URINE MICROSCOPIC-ADD ON     Status: Abnormal   Collection Time   07/11/11 11:25 PM      Component Value Range Comment    Squamous Epithelial / LPF RARE  RARE     WBC, UA 3-6  <3 (WBC/hpf)    RBC / HPF 3-6  <3 (RBC/hpf)    Bacteria, UA FEW (*) RARE     Urine-Other MUCOUS PRESENT     GLUCOSE, CAPILLARY     Status: Abnormal   Collection Time   07/12/11  2:58 AM      Component Value Range Comment   Glucose-Capillary 169 (*) 70 - 99 (mg/dL)    Comment 1 Documented in Chart      Comment 2 Notify RN     CBC     Status: Abnormal   Collection Time   07/12/11  3:10 AM      Component Value Range Comment   WBC 16.2 (*) 4.0 - 10.5 (K/uL)    RBC 5.12  4.22 - 5.81 (MIL/uL)    Hemoglobin 15.1  13.0 - 17.0 (g/dL)    HCT 44.6  39.0 - 52.0 (%)    MCV 87.1  78.0 - 100.0 (fL)    MCH 29.5  26.0 - 34.0 (pg)    MCHC 33.9  30.0 - 36.0 (g/dL)    RDW 13.8  11.5 - 15.5 (%)    Platelets 335  150 - 400 (K/uL)   DIFFERENTIAL     Status: Abnormal   Collection Time   07/12/11  3:10 AM      Component Value Range Comment   Neutrophils Relative 75  43 - 77 (%)    Neutro Abs 12.1 (*) 1.7 - 7.7 (K/uL)    Lymphocytes Relative 17  12 - 46 (%)    Lymphs Abs 2.7  0.7 - 4.0 (K/uL)    Monocytes Relative 8  3 - 12 (%)    Monocytes Absolute 1.3 (*) 0.1 - 1.0 (K/uL)    Eosinophils Relative 0  0 - 5 (%)    Eosinophils Absolute 0.0  0.0 - 0.7 (K/uL)    Basophils Relative 0  0 - 1 (%)    Basophils Absolute 0.0  0.0 - 0.1 (K/uL)   POCT I-STAT, CHEM 8     Status: Abnormal   Collection Time   07/12/11  3:22 AM      Component Value Range Comment   Sodium 138  135 - 145 (mEq/L)    Potassium 3.9  3.5 - 5.1 (mEq/L)    Chloride 105  96 - 112 (mEq/L)    BUN 18  6 - 23 (mg/dL)    Creatinine, Ser 1.50 (*) 0.50 - 1.35 (mg/dL)    Glucose, Bld 183 (*) 70 - 99 (mg/dL)    Calcium, Ion 1.18  1.12 - 1.32 (mmol/L)    TCO2 23  0 - 100 (  mmol/L)    Hemoglobin 15.6  13.0 - 17.0 (g/dL)    HCT 46.0  39.0 - 52.0 (%)    No results found for this or any previous visit (from the past 240 hour(s)). Creatinine:  Basename 07/12/11 0322  CREATININE 1.50*     Radiologic Imaging: Ct Abdomen Pelvis Wo Contrast  07/12/2011  *RADIOLOGY REPORT*  Clinical Data: Right flank pain.  Nausea and vomiting.  History of kidney infections and stones.  Red cells and white cells in urine.  CT ABDOMEN AND PELVIS WITHOUT CONTRAST  Technique:  Multidetector CT imaging of the abdomen and pelvis was performed following the standard protocol without intravenous contrast.  Comparison: 11/27/2003  Findings: The lung bases are clear.  10 mm stone in the right ureteropelvic junction with periureteral stranding, proximal pyelocaliectasis, and pararenal stranding consistent with moderate obstruction.  This is new since the previous study.  There is a second smaller stone in the right renal pelvis proximal to the obstructing stone and measuring about 5 mm diameter.  The distal right ureter is decompressed and no bladder stones are visualized. No pyelocaliectasis, ureterectasis, or stone on the left.  Low attenuation change throughout the liver consistent with fatty infiltration.  Slightly increased density in the gallbladder suggesting sludge.  No discrete stones visualized.  No gallbladder distension or wall thickening.  Spleen size is normal.  The pancreas, adrenal glands, abdominal aorta, retroperitoneal lymph nodes, stomach and small bowel are normal for unenhanced scan. No free fluid or free air in the abdomen.  Pelvis:  No free or loculated pelvic fluid collections.  Focal inflammatory stranding in the right pelvic fat appears to be extending down from the obstructing right ureteral stone and is probably related to inflammation or extravasation related to the stone. The bladder wall is not thickened.  No free or loculated pelvic fluid collections.  The appendix is normal.  No significant pelvic lymphadenopathy.  Degenerative disc disease at L4-5.  IMPRESSION: Moderately obstructing 10 mm stone in the right ureteropelvic junction.  There is a second 5 mm stone more proximally in the right  renal pelvis.  Original Report Authenticated By: WILLIAM R. STEVENS, M.D.    I independently reviewed the above imaging studies.  Impression/Assessment:  Right proximal ureteral stone (10 mm) and right renal calculus (5 mm)  Plan:  Due to the fact that his pain cannot be well assessed, I discussed options with the patient's father who is his primary caretaker and medical POA. We have reviewed options including PCNL, ureteroscopic therapy, and ESWL as options considering the size of stone and unlikely possibility of spontaneous stone passage.  Considering the inability to assess his pain, we have decided to proceed with intervention today and he will undergo cystoscopy and stent placement to relieve any significant pain.  I think he will be best served with ureteroscopic laser lithotripsy for definitive therapy considering that he likely will not do well with sedation with ESWL since he won't be able to communicate his pain and his stone is not large enough to warrant a more invasive PCNL.  His father is agreeable to proceed with a stent today.  I discussed the potential benefits and risks of the procedure, side effects of the proposed treatment, the likelihood of the patient achieving the goals of the procedure, and any potential problems that might occur during the procedure or recuperation.    Jeanett Antonopoulos,LES 07/12/2011, 6:17 AM    Dawnna Gritz S. Kamil Mchaffie, Jr. MD  

## 2011-07-19 NOTE — Discharge Instructions (Signed)

## 2011-07-19 NOTE — Anesthesia Postprocedure Evaluation (Signed)
  Anesthesia Post-op Note  Patient: Julian Reyes  Procedure(s) Performed:  CYSTOSCOPY/RETROGRADE/URETEROSCOPY/STONE EXTRACTION WITH BASKET  Patient Location: PACU  Anesthesia Type: General  Level of Consciousness: awake and alert   Airway and Oxygen Therapy: Patient Spontanous Breathing  Post-op Pain: mild  Post-op Assessment: Post-op Vital signs reviewed, Patient's Cardiovascular Status Stable, Respiratory Function Stable, Patent Airway and No signs of Nausea or vomiting  Post-op Vital Signs: stable  Complications: No apparent anesthesia complications

## 2011-07-19 NOTE — Transfer of Care (Signed)
Immediate Anesthesia Transfer of Care Note  Patient: Julian Reyes  Procedure(s) Performed:  CYSTOSCOPY/RETROGRADE/URETEROSCOPY/STONE EXTRACTION WITH BASKET  Patient Location: PACU  Anesthesia Type: General  Level of Consciousness: awake, alert  and patient cooperative  Airway & Oxygen Therapy: Patient Spontanous Breathing and Patient connected to face mask oxygen  Post-op Assessment: Report given to PACU RN and Post -op Vital signs reviewed and stable  Post vital signs: Reviewed and stable Filed Vitals:   07/19/11 0857  BP: 160/97  Pulse: 103  Temp: 35.7 C  Resp: 16    Complications: No apparent anesthesia complications

## 2011-07-19 NOTE — Interval H&P Note (Signed)
History and Physical Interval Note:  07/19/2011 10:14 AM  Julian Reyes  has presented today for surgery, with the diagnosis of Right Ureteral and Right Renal Calculi  The various methods of treatment have been discussed with the patient and family. After consideration of risks, benefits and other options for treatment, the patient has consented to  Procedure(s): CYSTOSCOPY/RETROGRADE/URETEROSCOPY/STONE EXTRACTION WITH BASKET as a surgical intervention .  The patients' history has been reviewed, patient examined, no change in status, stable for surgery.  I have reviewed the patients' chart and labs.  Questions were answered to the patient's satisfaction.     Aloria Looper,LES

## 2011-07-19 NOTE — Progress Notes (Signed)
Returned to short stay .Father with pt. Pt singing quietly and co-operative

## 2011-07-19 NOTE — Op Note (Signed)
Preoperative diagnosis: Right ureteral and renal calculi  Postoperative diagnosis: Right ureteral and renal calculi  Procedure:  1. Cystoscopy 2. Right ureteroscopy and stone removal 3. Ureteroscopic laser lithotripsy 4. Right ureteral stent placement (6 x 24) 5. Right retrograde pyelography with interpretation  Surgeon: Moody Bruins. M.D.  Anesthesia: General  Complications: None  Intraoperative findings: Right retrograde pyelography demonstrated a filling defect within the right ureter and renal pelvis consistent with the patient's known calculus without other abnormalities noted.  EBL: Minimal  Specimens: 1. Right ureteral and renal calculi  Disposition of specimens: [facility] for stone analysis  Indication: Julian Reyes  is a 51 y.o. patient with urolithiasis. After reviewing the management options for treatment, he elected to proceed with the above surgical procedure(s). We have discussed the potential benefits and risks of the procedure, side effects of the proposed treatment, the likelihood of the patient achieving the goals of the procedure, and any potential problems that might occur during the procedure or recuperation. Informed consent has been obtained.  Description of procedure:  The patient was taken to the operating room and general anesthesia was induced.  The patient was placed in the dorsal lithotomy position, prepped and draped in the usual sterile fashion, and preoperative antibiotics were administered. A preoperative time-out was performed.   Cystourethroscopy was performed.  The patient's urethra was examined and was normal. The bladder was then systematically examined in its entirety. There was no evidence for any bladder tumors, stones, or other mucosal pathology.    Attention then turned to the right ureteral orifice and a ureteral catheter was used to intubate the ureteral orifice.  Omnipaque contrast was injected through the  ureteral catheter and a retrograde pyelogram was performed with findings as dictated above.  A 0.38 sensor guidewire was then advanced up the right ureter into the renal pelvis under fluoroscopic guidance.  A 12/14 Fr ureteral access sheath was then advance over the guide wire. The digital flexible ureteroscope was then advanced through the access sheath into the ureter next to the guidewire and the calculus was identified and was located in the renal pelvis having been pushed back out of the ureter.   The stone was then fragmented with the 200 micron holmium laser fiber on a setting of 0.8 J and frequency of 6 Hz.   All sizable stones were then removed with a zero tip nitinol basket.  Reinspection of the ureter/renal pelvis revealed no remaining visible stones or fragments of significant size.   The safety wire was then replaced and the access sheath removed.  The guidewire was backloaded through the cystoscope and a ureteral stent was advance over the wire using Seldinger technique.  The stent was positioned appropriately under fluoroscopic and cystoscopic guidance.  The wire was then removed with an adequate stent curl noted in the renal pelvis as well as in the bladder.  The bladder was then emptied and the procedure ended.  The patient appeared to tolerate the procedure well and without complications.  The patient was able to be awakened and transferred to the recovery unit in satisfactory condition.   Moody Bruins MD

## 2011-07-19 NOTE — Anesthesia Preprocedure Evaluation (Addendum)
Anesthesia Evaluation  Patient identified by MRN, date of birth, ID band Patient awake    Reviewed: Allergy & Precautions, H&P , NPO status , Patient's Chart, lab work & pertinent test results  Airway Mallampati: II TM Distance: >3 FB Neck ROM: Full    Dental No notable dental hx.    Pulmonary neg pulmonary ROS,  clear to auscultation  Pulmonary exam normal       Cardiovascular hypertension, Pt. on medications neg cardio ROS Regular Normal    Neuro/Psych PSYCHIATRIC DISORDERS Autistic. Does not speakNegative Neurological ROS  Negative Psych ROS   GI/Hepatic negative GI ROS, Neg liver ROS,   Endo/Other  Negative Endocrine ROSDiabetes mellitus-, Type 2, Oral Hypoglycemic Agents  Renal/GU negative Renal ROS  Genitourinary negative   Musculoskeletal negative musculoskeletal ROS (+)   Abdominal (+) obese,   Peds negative pediatric ROS (+)  Hematology negative hematology ROS (+)   Anesthesia Other Findings   Reproductive/Obstetrics negative OB ROS                          Anesthesia Physical Anesthesia Plan  ASA: III  Anesthesia Plan: General   Post-op Pain Management:    Induction: Intravenous  Airway Management Planned: LMA  Additional Equipment:   Intra-op Plan:   Post-operative Plan: Extubation in OR  Informed Consent: I have reviewed the patients History and Physical, chart, labs and discussed the procedure including the risks, benefits and alternatives for the proposed anesthesia with the patient or authorized representative who has indicated his/her understanding and acceptance.   Dental advisory given  Plan Discussed with: CRNA  Anesthesia Plan Comments: (Did fine with lma ga about one week ago.)        Anesthesia Quick Evaluation

## 2011-07-23 ENCOUNTER — Encounter (HOSPITAL_COMMUNITY): Payer: Self-pay | Admitting: Urology

## 2011-08-03 ENCOUNTER — Encounter (HOSPITAL_COMMUNITY): Payer: Self-pay | Admitting: *Deleted

## 2011-08-03 ENCOUNTER — Other Ambulatory Visit: Payer: Self-pay | Admitting: Urology

## 2011-08-04 NOTE — H&P (Signed)
History of Present Illness  Julian Reyes is a 51 year old with Autism and is non-verbal.  He has the following urologic history:  1) Urolithiasis: He has a history of urolithiasis.  Prior treatment:   May 2005: Saw Dr. Patsi Sears after passing a stone Jan 2012: R ureteroscopic laser lithotripsy  Interval history:  He follows up today for further evaluation after his right ureteroscopic laser lithotripsy procedure. He tolerated his procedure well and has no specific complaints today. His father states that he has not complained of any pain. He is scheduled to undergo stent removal today.   Past Medical History Problems  1. History of  Autistic Disorder 299.00 2. History of  Diabetes Mellitus 250.00 3. History of  Hypertension 401.9  Surgical History Problems  1. History of  Cystoscopy With Insertion Of Ureteral Stent Right 2. History of  Cystoscopy With Ureteroscopy With Removal Of Calculus Right 3. History of  Partial Permanent Excision Of Nail And Matrix Toes  Current Meds 1. Actoplus Met 15-850 MG Oral Tablet; Therapy: 15Aug2012 to 2. AmLODIPine Besylate 10 MG Oral Tablet; Therapy: 04Sep2012 to 3. Atorvastatin Calcium 40 MG Oral Tablet; Therapy: 04Sep2012 to 4. Ciprofloxacin HCl 500 MG Oral Tablet; Therapy: 10Jan2013 to 5. GlyBURIDE 5 MG Oral Tablet; Therapy: 01Jan2013 to 6. Januvia 100 MG Oral Tablet; Therapy: 04Sep2012 to 7. Micardis HCT 80-12.5 MG Oral Tablet; Therapy: 04Sep2012 to 8. Omeprazole 20 MG Oral Capsule Delayed Release; Therapy: 13Oct2012 to  Allergies Medication  1. No Known Drug Allergies  Family History Problems  1. Family history of  Diabetes Mellitus V18.0 2. Family history of  Heart Disease V17.49  Social History Problems  1. Marital History - Single 2. Never A Smoker Denied  3. History of  Alcohol Use 4. History of  Caffeine Use  Vitals Vital Signs [Data Includes: Last 1 Day]  25Jan2013 12:58PM  Blood Pressure: 141 / 90 Heart Rate:  98 25Jan2013 12:55PM  BMI Calculated: 41.65 BSA Calculated: 2.17 Height: 5 ft 5 in Weight: 250 lb   Physical Exam Constitutional: Well nourished and well developed . No acute distress.    Results/Data Urine [Data Includes: Last 1 Day]   25Jan2013 COLOR AMBER  APPEARANCE CLOUDY  SPECIFIC GRAVITY 1.025  pH 5.5  GLUCOSE 500 mg/dL BILIRUBIN NEG  KETONE NEG mg/dL BLOOD LARGE  PROTEIN 30 mg/dL UROBILINOGEN 0.2 mg/dL NITRITE NEG  LEUKOCYTE ESTERASE TRACE  SQUAMOUS EPITHELIAL/HPF RARE  WBC 7-10 WBC/hpf RBC TNTC RBC/hpf BACTERIA MODERATE  CRYSTALS NONE SEEN  CASTS NONE SEEN    His urine will be cultured.   Procedure     Julian Reyes was visibly anxious about his procedure today.  His stone analysis demonstrated his stones had to be 80% uric acid.   Assessment Assessed  1. Proximal Ureteral Stone On The Right 592.1 2. Nephrolithiasis 592.0  Plan Health Maintenance (V70.0)  1. UA With REFLEX  Done: 25Jan2013 12:47PM Nephrolithiasis (592.0)  2. Potassium Citrate ER 10 MEQ (1080 MG) Oral Tablet Extended Release; TAKE 1 TABLET BID;  Therapy: 25Jan2013 to (Last Rx:25Jan2013) 3. RENAL U/S COMPLETE  Requested for: 25Jan2013 4. Follow-up Month x 6 Office  Follow-up  Requested for: 25Jan2013 Proximal Ureteral Stone On The Right (592.1)  5. Follow-up Schedule Surgery Office  Follow-up  Requested for: 25Jan2013 Urinary Tract Infection (599.0)  6. Ciprofloxacin HCl 500 MG Oral Tablet; Take 1 po BID; Therapy: 25Jan2013 to  (Complete:30Jan2013); Last Rx:25Jan2013 7. URINE CULTURE  Requested for: 25Jan2013  Discussion/Summary  1. Uric acid urolithiasis: He will  begin treatment potassium citrate 10 mEq p.o. b.i.d. and increase his dietary citrate. He will follow up in 6 months with a renal ultrasound and to recheck his urine pH.  2. Indwelling right ureteral stent: His urine has been cultured and will begin antibiotic prophylaxis with ciprofloxacin. Due to his anxiety associated with the  anticipated procedure today, this will be performed in the operating room under IV sedation early next week. We have reviewed the potential risks and complications associated with this procedure and his father gives informed consent.    Signatures Electronically signed by : Heloise Purpura, M.D.; Aug 03 2011  1:29PM

## 2011-08-06 ENCOUNTER — Encounter (HOSPITAL_COMMUNITY): Payer: Self-pay | Admitting: Certified Registered Nurse Anesthetist

## 2011-08-06 ENCOUNTER — Encounter (HOSPITAL_COMMUNITY)
Admission: RE | Disposition: A | Discharge status: Home / Self Care | Payer: Self-pay | Source: Ambulatory Visit | Attending: Urology

## 2011-08-06 ENCOUNTER — Ambulatory Visit (HOSPITAL_COMMUNITY): Payer: Medicare Other | Admitting: Certified Registered Nurse Anesthetist

## 2011-08-06 ENCOUNTER — Ambulatory Visit (HOSPITAL_COMMUNITY)
Admission: RE | Admit: 2011-08-06 | Discharge: 2011-08-06 | Disposition: A | Discharge status: Home / Self Care | Payer: Medicare Other | Source: Ambulatory Visit | Attending: Urology | Admitting: Urology

## 2011-08-06 ENCOUNTER — Encounter (HOSPITAL_COMMUNITY): Payer: Self-pay | Admitting: *Deleted

## 2011-08-06 DIAGNOSIS — E119 Type 2 diabetes mellitus without complications: Secondary | ICD-10-CM | POA: Insufficient documentation

## 2011-08-06 DIAGNOSIS — F840 Autistic disorder: Secondary | ICD-10-CM | POA: Insufficient documentation

## 2011-08-06 DIAGNOSIS — I10 Essential (primary) hypertension: Secondary | ICD-10-CM | POA: Insufficient documentation

## 2011-08-06 DIAGNOSIS — Z79899 Other long term (current) drug therapy: Secondary | ICD-10-CM | POA: Insufficient documentation

## 2011-08-06 DIAGNOSIS — N209 Urinary calculus, unspecified: Secondary | ICD-10-CM | POA: Insufficient documentation

## 2011-08-06 HISTORY — PX: CYSTOSCOPY W/ URETERAL STENT REMOVAL: SHX1430

## 2011-08-06 LAB — BASIC METABOLIC PANEL
BUN: 13 mg/dL (ref 6–23)
CO2: 25 mEq/L (ref 19–32)
Calcium: 9.8 mg/dL (ref 8.4–10.5)
Chloride: 99 mEq/L (ref 96–112)
Creatinine, Ser: 1.08 mg/dL (ref 0.50–1.35)
GFR calc Af Amer: 89 mL/min — ABNORMAL LOW (ref >90)
GFR calc non Af Amer: 77 mL/min — ABNORMAL LOW (ref >90)
Glucose, Bld: 127 mg/dL — ABNORMAL HIGH (ref 70–99)
Potassium: 3.5 mEq/L (ref 3.5–5.1)
Sodium: 136 mEq/L (ref 135–145)

## 2011-08-06 LAB — SURGICAL PCR SCREEN
MRSA, PCR: NEGATIVE
Staphylococcus aureus: NEGATIVE

## 2011-08-06 SURGERY — REMOVAL, STENT, URETER, CYSTOSCOPIC
Anesthesia: General | Laterality: Right | Wound class: Clean Contaminated

## 2011-08-06 MED ORDER — CIPROFLOXACIN IN D5W 400 MG/200ML IV SOLN: 400.0000 mg | INTRAVENOUS | Status: DC

## 2011-08-06 MED ORDER — VANCOMYCIN HCL IN DEXTROSE 1-5 GM/200ML-% IV SOLN
INTRAVENOUS | Status: AC
  Filled 2011-08-06: qty 200

## 2011-08-06 MED ORDER — VANCOMYCIN HCL IN DEXTROSE 1-5 GM/200ML-% IV SOLN
1000.0000 mg | Freq: Once | INTRAVENOUS | Status: AC
  Administered 2011-08-06: 1000 mg via INTRAVENOUS

## 2011-08-06 MED ORDER — STERILE WATER FOR IRRIGATION IR SOLN
Status: DC | PRN
  Administered 2011-08-06: 3000 mL

## 2011-08-06 MED ORDER — LIDOCAINE HCL (CARDIAC) 20 MG/ML IV SOLN
INTRAVENOUS | Status: DC | PRN
  Administered 2011-08-06: 50 mg via INTRAVENOUS

## 2011-08-06 MED ORDER — FENTANYL CITRATE 0.05 MG/ML IJ SOLN: 25.0000 ug | INTRAMUSCULAR | Status: DC | PRN

## 2011-08-06 MED ORDER — PROPOFOL 10 MG/ML IV BOLUS
INTRAVENOUS | Status: DC | PRN
  Administered 2011-08-06: 180 mg via INTRAVENOUS

## 2011-08-06 MED ORDER — FENTANYL CITRATE 0.05 MG/ML IJ SOLN
INTRAMUSCULAR | Status: DC | PRN
  Administered 2011-08-06 (×2): 25 ug via INTRAVENOUS
  Administered 2011-08-06: 100 ug via INTRAVENOUS

## 2011-08-06 MED ORDER — LACTATED RINGERS IV SOLN
INTRAVENOUS | Status: DC | PRN
  Administered 2011-08-06: 14:00:00 via INTRAVENOUS

## 2011-08-06 MED ORDER — ONDANSETRON HCL 4 MG/2ML IJ SOLN
INTRAMUSCULAR | Status: DC | PRN
  Administered 2011-08-06: 4 mg via INTRAVENOUS

## 2011-08-06 MED ORDER — PROMETHAZINE HCL 25 MG/ML IJ SOLN: 6.2500 mg | INTRAMUSCULAR | Status: DC | PRN

## 2011-08-06 MED ORDER — DOXYCYCLINE HYCLATE 50 MG PO CAPS: 100.0000 mg | ORAL_CAPSULE | Freq: Two times a day (BID) | ORAL | Status: AC

## 2011-08-06 SURGICAL SUPPLY — 15 items
ADAPTER CATH URET PLST 4-6FR (CATHETERS) IMPLANT
BAG URO CATCHER STRL LF (DRAPE) IMPLANT
BASKET ZERO TIP NITINOL 2.4FR (BASKET) IMPLANT
CATH INTERMIT  6FR 70CM (CATHETERS) IMPLANT
CLOTH BEACON ORANGE TIMEOUT ST (SAFETY) IMPLANT
DRAPE CAMERA CLOSED 9X96 (DRAPES) IMPLANT
GLOVE BIOGEL M STRL SZ7.5 (GLOVE) ×2 IMPLANT
GOWN STRL NON-REIN LRG LVL3 (GOWN DISPOSABLE) IMPLANT
GUIDEWIRE ANG ZIPWIRE 038X150 (WIRE) IMPLANT
GUIDEWIRE STR DUAL SENSOR (WIRE) IMPLANT
MANIFOLD NEPTUNE II (INSTRUMENTS) IMPLANT
PACK CYSTO (CUSTOM PROCEDURE TRAY) IMPLANT
SET CYSTO W/LG BORE CLAMP LF (SET/KITS/TRAYS/PACK) ×2 IMPLANT
SPONGE GAUZE 4X4 12PLY (GAUZE/BANDAGES/DRESSINGS) ×2 IMPLANT
TUBING CONNECTING 10 (TUBING) IMPLANT

## 2011-08-06 NOTE — Anesthesia Postprocedure Evaluation (Signed)
  Anesthesia Post-op Note  Patient: Julian Reyes  Procedure(s) Performed:  CYSTOSCOPY WITH STENT REMOVAL - Cystscopy with Right Ureteral Stent Removal   Patient Location: PACU  Anesthesia Type: General  Level of Consciousness: awake and alert   Airway and Oxygen Therapy: Patient Spontanous Breathing  Post-op Pain: mild  Post-op Assessment: Post-op Vital signs reviewed, Patient's Cardiovascular Status Stable, Respiratory Function Stable, Patent Airway and No signs of Nausea or vomiting  Post-op Vital Signs: stable  Complications: No apparent anesthesia complications

## 2011-08-06 NOTE — Op Note (Signed)
Preoperative diagnosis: Right ureteral stent  Postoperative diagnosis: Same  Procedure:  1. Cystoscopy 2. Right ureteral stent removal  Surgeon: Moody Bruins. M.D.  Anesthesia: General  Complications: None   Indication: Julian Reyes  is a 51 y.o. patient with urolithiasis. After reviewing the management options for treatment, he elected to proceed with the above surgical procedure(s). We have discussed the potential benefits and risks of the procedure, side effects of the proposed treatment, the likelihood of the patient achieving the goals of the procedure, and any potential problems that might occur during the procedure or recuperation. Informed consent has been obtained.  Description of procedure:  The patient was taken to the operating room and general anesthesia was induced.  The patient was placed in the supine position, prepped and draped in the usual sterile fashion, and preoperative antibiotics were administered. A preoperative time-out was performed.   Cystourethroscopy was performed. The indwelling right ureteral stent was identified and removed with the flexible graspers.  The patient appeared to tolerate the procedure well and without complications.  The patient was able to be awakened and transferred to the recovery unit in satisfactory condition.   Moody Bruins MD

## 2011-08-06 NOTE — Anesthesia Preprocedure Evaluation (Signed)
Anesthesia Evaluation  Patient identified by MRN, date of birth, ID band Patient awake  General Assessment Comment:autistic  Reviewed: Allergy & Precautions, H&P , NPO status , Patient's Chart, lab work & pertinent test results  Airway Mallampati: II TM Distance: >3 FB Neck ROM: Full    Dental No notable dental hx.    Pulmonary neg pulmonary ROS,  clear to auscultation  Pulmonary exam normal       Cardiovascular hypertension, Pt. on medications neg cardio ROS Regular Normal    Neuro/Psych Negative Neurological ROS  Negative Psych ROS   GI/Hepatic negative GI ROS, Neg liver ROS,   Endo/Other  Negative Endocrine ROSDiabetes mellitus-Morbid obesity  Renal/GU negative Renal ROS  Genitourinary negative   Musculoskeletal negative musculoskeletal ROS (+)   Abdominal   Peds negative pediatric ROS (+)  Hematology negative hematology ROS (+)   Anesthesia Other Findings   Reproductive/Obstetrics negative OB ROS                           Anesthesia Physical Anesthesia Plan  ASA: III  Anesthesia Plan: General   Post-op Pain Management:    Induction: Intravenous  Airway Management Planned: LMA  Additional Equipment:   Intra-op Plan:   Post-operative Plan:   Informed Consent: I have reviewed the patients History and Physical, chart, labs and discussed the procedure including the risks, benefits and alternatives for the proposed anesthesia with the patient or authorized representative who has indicated his/her understanding and acceptance.   Dental advisory given  Plan Discussed with: CRNA  Anesthesia Plan Comments:         Anesthesia Quick Evaluation

## 2011-08-06 NOTE — Interval H&P Note (Signed)
History and Physical Interval Note:  08/06/2011 1:35 PM  Julian Reyes  has presented today for surgery, with the diagnosis of Right Ureteral Stent  The various methods of treatment have been discussed with the patient and family. After consideration of risks, benefits and other options for treatment, the patient has consented to  Procedure(s): CYSTOSCOPY WITH STENT REMOVAL as a surgical intervention .  The patients' history has been reviewed, patient examined, no change in status, stable for surgery.  I have reviewed the patients' chart and labs.  Questions were answered to the patient's satisfaction.     Irving Lubbers,LES

## 2011-08-06 NOTE — Transfer of Care (Signed)
Immediate Anesthesia Transfer of Care Note  Patient: Julian Reyes  Procedure(s) Performed:  CYSTOSCOPY WITH STENT REMOVAL - Cystscopy with Right Ureteral Stent Removal   Patient Location: PACU  Anesthesia Type: General  Level of Consciousness: awake and patient cooperative  Airway & Oxygen Therapy: Patient Spontanous Breathing  Post-op Assessment: Report given to PACU RN and Post -op Vital signs reviewed and stable  Post vital signs: Reviewed and stable  Complications: No apparent anesthesia complications

## 2011-08-06 NOTE — Anesthesia Procedure Notes (Signed)
Procedure Name: LMA Insertion Date/Time: 08/06/2011 2:27 PM Performed by: [place], Santos Sollenberger C Pre-anesthesia Checklist: Patient identified, Timeout performed, Emergency Drugs available, Patient being monitored and Suction available Patient Re-evaluated:Patient Re-evaluated prior to inductionOxygen Delivery Method: Circle System Utilized Preoxygenation: Pre-oxygenation with 100% oxygen Intubation Type: IV induction LMA: LMA inserted LMA Size: 4.0 Number of attempts: 1 Tube secured with: Tape Dental Injury: Teeth and Oropharynx as per pre-operative assessment

## 2011-08-06 NOTE — Progress Notes (Signed)
Pt very agitated coming back from PACU. Wants to take out IV and climbing off stretcher. Requested father to room. Pt is able to follow commands ; however, he is not able to communicate. RN tried to expedite discharge and over looked Rx for doxycycline. Called pt's mother and explained pt to stop Cipro and start doxycycline 100mg  BID for 10 doses. She verbalizes understanding. Called Rx to [place] [phone]).

## 2011-08-08 ENCOUNTER — Encounter (HOSPITAL_COMMUNITY): Payer: Self-pay | Admitting: Urology

## 2011-08-08 MED FILL — Mupirocin Oint 2%: CUTANEOUS | Qty: 22 | Status: AC

## 2013-11-20 ENCOUNTER — Encounter (HOSPITAL_COMMUNITY): Payer: Self-pay | Admitting: Emergency Medicine

## 2013-11-20 ENCOUNTER — Emergency Department (INDEPENDENT_AMBULATORY_CARE_PROVIDER_SITE_OTHER)
Admission: EM | Admit: 2013-11-20 | Discharge: 2013-11-20 | Disposition: A | Discharge status: Home / Self Care | Payer: Medicare Other | Source: Home / Self Care | Attending: Family Medicine | Admitting: Family Medicine

## 2013-11-20 DIAGNOSIS — J069 Acute upper respiratory infection, unspecified: Secondary | ICD-10-CM | POA: Diagnosis not present

## 2013-11-20 MED ORDER — AZITHROMYCIN 250 MG PO TABS
ORAL_TABLET | ORAL | Status: DC
Start: 2013-11-20 — End: 2017-11-26

## 2013-11-20 MED ORDER — CETIRIZINE HCL 10 MG PO TABS: 10.0000 mg | ORAL_TABLET | Freq: Every day | ORAL | Status: DC

## 2013-11-20 MED ORDER — GUAIFENESIN-CODEINE 100-10 MG/5ML PO SYRP: 10.0000 mL | ORAL_SOLUTION | Freq: Four times a day (QID) | ORAL | Status: DC | PRN

## 2013-11-20 NOTE — ED Notes (Signed)
Family reports patient has a bad cough and poor appetite for 3 days

## 2013-11-20 NOTE — ED Provider Notes (Signed)
CSN: [id number]     Arrival date & time 11/20/13  1255 History   First MD Initiated Contact with Patient 11/20/13 1457     No chief complaint on file.  (Consider location/radiation/quality/duration/timing/severity/associated sxs/prior Treatment) Patient is a 53 y.o. male presenting with cough. The history is provided by a relative.  Cough Cough characteristics:  Non-productive, dry and harsh Severity:  Mild Onset quality:  Gradual Duration:  3 days Progression:  Unchanged Chronicity:  New Smoker: no   Context: weather changes   Associated symptoms: rhinorrhea   Associated symptoms: no chest pain, no fever, no rash and no shortness of breath     Past Medical History  Diagnosis Date  . Diabetes mellitus   . Chronic kidney disease 07-18-11    kidney stones  . Autism 07-18-11    pt. moans and makes verbal sounds,doesn't talk   Past Surgical History  Procedure Laterality Date  . Cystoscopy w/ ureteral stent placement  07/12/2011    Procedure: CYSTOSCOPY WITH RETROGRADE PYELOGRAM/URETERAL STENT PLACEMENT;  Surgeon: Dutch Gray, MD;  Location: [facility];  Service: Urology;  Laterality: Right;  . Cystoscopy/retrograde/ureteroscopy/stone extraction with basket  07/19/2011    Procedure: CYSTOSCOPY/RETROGRADE/URETEROSCOPY/STONE EXTRACTION WITH BASKET;  Surgeon: Dutch Gray, MD;  Location: [facility];  Service: Urology;  Laterality: Right;  . Ingrown toenails    . Cystoscopy w/ ureteral stent removal  08/06/2011    Procedure: CYSTOSCOPY WITH STENT REMOVAL;  Surgeon: Dutch Gray, MD;  Location: [facility];  Service: Urology;  Laterality: Right;  Cystscopy with Right Ureteral Stent Removal    No family history on file. History  Substance Use Topics  . Smoking status: Never Smoker   . Smokeless tobacco: Not on file  . Alcohol Use: No    Review of Systems  Constitutional: Negative.  Negative for fever.  HENT: Positive for congestion and rhinorrhea.   Respiratory: Positive for cough. Negative for  shortness of breath.   Cardiovascular: Negative for chest pain.  Skin: Negative for rash.    Allergies  Penicillins  Home Medications   Prior to Admission medications   Medication Sig Start Date End Date Taking? Authorizing Provider  amLODipine (NORVASC) 10 MG tablet Take 10 mg by mouth every morning.     Historical Provider, MD  atorvastatin (LIPITOR) 40 MG tablet Take 40 mg by mouth every morning.     Historical Provider, MD  Flaxseed, Linseed, (FLAXSEED OIL) 1000 MG CAPS Take 1 capsule by mouth 2 (two) times daily.      Historical Provider, MD  glyBURIDE (DIABETA) 5 MG tablet Take 5 mg by mouth daily with breakfast.     Historical Provider, MD  Hydrocodone-Acetaminophen 5-300 MG TABS Take 1-2 tablets by mouth every 6 (six) hours. For pain. 07/12/11   Raynelle Bring, MD  Omega-3 Fatty Acids (FISH OIL) 1200 MG CAPS Take 1 capsule by mouth 2 (two) times daily.     Historical Provider, MD  pioglitazone-metformin (ACTOPLUS MET) 15-850 MG per tablet Take 1 tablet by mouth 2 (two) times daily with a meal.     Historical Provider, MD   BP 140/81  Pulse 91  Temp(Src) 97 F (36.1 C) (Oral)  Resp 16  SpO2 96% Physical Exam  Nursing note and vitals reviewed. Constitutional: He is oriented to person, place, and time. He appears well-developed and well-nourished.  HENT:  Head: Normocephalic.  Right Ear: External ear normal.  Left Ear: External ear normal.  Mouth/Throat: Oropharynx is clear and moist.  Eyes: Conjunctivae are normal.  Pupils are equal, round, and reactive to light.  Neck: Normal range of motion. Neck supple.  Cardiovascular: Regular rhythm and normal heart sounds.   Pulmonary/Chest: Effort normal and breath sounds normal.  Neurological: He is alert and oriented to person, place, and time.  Skin: Skin is warm.    ED Course  Procedures (including critical care time) Labs Review Labs Reviewed - No data to display  Imaging Review No results found.   MDM   1. URI  (upper respiratory infection)        Billy Fischer, MD 11/20/13 1535

## 2013-11-20 NOTE — Discharge Instructions (Signed)
Drink plenty of fluids as discussed, use medicine as prescribed, and mucinex or delsym for cough. Return or see your doctor if further problems °

## 2014-04-05 DIAGNOSIS — E785 Hyperlipidemia, unspecified: Secondary | ICD-10-CM | POA: Insufficient documentation

## 2014-04-05 DIAGNOSIS — E119 Type 2 diabetes mellitus without complications: Secondary | ICD-10-CM | POA: Insufficient documentation

## 2014-04-05 DIAGNOSIS — I152 Hypertension secondary to endocrine disorders: Secondary | ICD-10-CM | POA: Insufficient documentation

## 2014-04-05 DIAGNOSIS — I10 Essential (primary) hypertension: Secondary | ICD-10-CM | POA: Insufficient documentation

## 2014-04-05 DIAGNOSIS — E1159 Type 2 diabetes mellitus with other circulatory complications: Secondary | ICD-10-CM | POA: Insufficient documentation

## 2014-04-05 DIAGNOSIS — F840 Autistic disorder: Secondary | ICD-10-CM | POA: Insufficient documentation

## 2014-04-05 DIAGNOSIS — K219 Gastro-esophageal reflux disease without esophagitis: Secondary | ICD-10-CM | POA: Insufficient documentation

## 2015-01-17 DIAGNOSIS — E1165 Type 2 diabetes mellitus with hyperglycemia: Secondary | ICD-10-CM | POA: Diagnosis not present

## 2015-01-17 DIAGNOSIS — E559 Vitamin D deficiency, unspecified: Secondary | ICD-10-CM | POA: Diagnosis not present

## 2015-01-17 DIAGNOSIS — E784 Other hyperlipidemia: Secondary | ICD-10-CM | POA: Diagnosis not present

## 2015-01-17 DIAGNOSIS — I10 Essential (primary) hypertension: Secondary | ICD-10-CM | POA: Diagnosis not present

## 2015-01-17 DIAGNOSIS — Z79899 Other long term (current) drug therapy: Secondary | ICD-10-CM | POA: Diagnosis not present

## 2015-04-19 DIAGNOSIS — E1165 Type 2 diabetes mellitus with hyperglycemia: Secondary | ICD-10-CM | POA: Diagnosis not present

## 2015-04-19 DIAGNOSIS — I10 Essential (primary) hypertension: Secondary | ICD-10-CM | POA: Diagnosis not present

## 2015-04-19 DIAGNOSIS — Z23 Encounter for immunization: Secondary | ICD-10-CM | POA: Diagnosis not present

## 2015-04-19 DIAGNOSIS — E559 Vitamin D deficiency, unspecified: Secondary | ICD-10-CM | POA: Diagnosis not present

## 2015-04-19 DIAGNOSIS — E784 Other hyperlipidemia: Secondary | ICD-10-CM | POA: Diagnosis not present

## 2015-04-19 DIAGNOSIS — Z79899 Other long term (current) drug therapy: Secondary | ICD-10-CM | POA: Diagnosis not present

## 2015-07-26 DIAGNOSIS — E1165 Type 2 diabetes mellitus with hyperglycemia: Secondary | ICD-10-CM | POA: Diagnosis not present

## 2015-07-26 DIAGNOSIS — I10 Essential (primary) hypertension: Secondary | ICD-10-CM | POA: Diagnosis not present

## 2015-09-27 ENCOUNTER — Emergency Department (INDEPENDENT_AMBULATORY_CARE_PROVIDER_SITE_OTHER): Payer: Medicare Other

## 2015-09-27 ENCOUNTER — Emergency Department (INDEPENDENT_AMBULATORY_CARE_PROVIDER_SITE_OTHER)
Admission: EM | Admit: 2015-09-27 | Discharge: 2015-09-27 | Disposition: A | Discharge status: Home / Self Care | Payer: Medicare Other | Source: Home / Self Care | Attending: Family Medicine | Admitting: Family Medicine

## 2015-09-27 DIAGNOSIS — M25562 Pain in left knee: Secondary | ICD-10-CM | POA: Diagnosis not present

## 2015-09-27 NOTE — ED Provider Notes (Signed)
CSN: [id number]     Arrival date & time 09/27/15  1324 History   First MD Initiated Contact with Patient 09/27/15 1519     No chief complaint on file.  (Consider location/radiation/quality/duration/timing/severity/associated sxs/prior Treatment) HPI History obtained from patient:   LOCATION:left knee SEVERITY: DURATION:2 weeks CONTEXT: unsure of any injury, patient is autistic QUALITY: MODIFYING FACTORS:none ASSOCIATED SYMPTOMS:some limping TIMING:constant OCCUPATION:  Past Medical History  Diagnosis Date  . Diabetes mellitus   . Chronic kidney disease 07-18-11    kidney stones  . Autism 07-18-11    pt. moans and makes verbal sounds,doesn't talk   Past Surgical History  Procedure Laterality Date  . Cystoscopy w/ ureteral stent placement  07/12/2011    Procedure: CYSTOSCOPY WITH RETROGRADE PYELOGRAM/URETERAL STENT PLACEMENT;  Surgeon: Dutch Gray, MD;  Location: [facility];  Service: Urology;  Laterality: Right;  . Cystoscopy/retrograde/ureteroscopy/stone extraction with basket  07/19/2011    Procedure: CYSTOSCOPY/RETROGRADE/URETEROSCOPY/STONE EXTRACTION WITH BASKET;  Surgeon: Dutch Gray, MD;  Location: [facility];  Service: Urology;  Laterality: Right;  . Ingrown toenails    . Cystoscopy w/ ureteral stent removal  08/06/2011    Procedure: CYSTOSCOPY WITH STENT REMOVAL;  Surgeon: Dutch Gray, MD;  Location: [facility];  Service: Urology;  Laterality: Right;  Cystscopy with Right Ureteral Stent Removal    No family history on file. Social History  Substance Use Topics  . Smoking status: Never Smoker   . Smokeless tobacco: Not on file  . Alcohol Use: No    Review of Systems Left knee  Allergies  Penicillins  Home Medications   Prior to Admission medications   Medication Sig Start Date End Date Taking? Authorizing Provider  amLODipine (NORVASC) 10 MG tablet Take 10 mg by mouth every morning.     Historical Provider, MD  atorvastatin (LIPITOR) 40 MG tablet Take 40 mg by mouth every  morning.     Historical Provider, MD  azithromycin (ZITHROMAX Z-PAK) 250 MG tablet Take as directed on pack 11/20/13   Billy Fischer, MD  cetirizine (ZYRTEC) 10 MG tablet Take 1 tablet (10 mg total) by mouth daily. One tab daily for allergies 11/20/13   Billy Fischer, MD  Flaxseed, Linseed, (FLAXSEED OIL) 1000 MG CAPS Take 1 capsule by mouth 2 (two) times daily.      Historical Provider, MD  glyBURIDE (DIABETA) 5 MG tablet Take 5 mg by mouth daily with breakfast.     Historical Provider, MD  guaiFENesin-codeine (ROBITUSSIN AC) 100-10 MG/5ML syrup Take 10 mLs by mouth 4 (four) times daily as needed for cough. 11/20/13   Billy Fischer, MD  Hydrocodone-Acetaminophen 5-300 MG TABS Take 1-2 tablets by mouth every 6 (six) hours. For pain. 07/12/11   Raynelle Bring, MD  Omega-3 Fatty Acids (FISH OIL) 1200 MG CAPS Take 1 capsule by mouth 2 (two) times daily.     Historical Provider, MD  pioglitazone-metformin (ACTOPLUS MET) 15-850 MG per tablet Take 1 tablet by mouth 2 (two) times daily with a meal.     Historical Provider, MD   Meds Ordered and Administered this Visit  Medications - No data to display  There were no vitals taken for this visit. No data found.   Physical Exam  Constitutional: He appears well-developed and well-nourished.  Musculoskeletal:       Left knee: Normal. He exhibits normal range of motion, no swelling and no LCL laxity.  Nursing note and vitals reviewed.   ED Course  Procedures (including critical care time)  Labs  Review Labs Reviewed - No data to display  Imaging Review Dg Knee Complete 4 Views Left  09/27/2015  CLINICAL DATA:  Left knee pain, initial encounter EXAM: LEFT KNEE - COMPLETE 4+ VIEW COMPARISON:  None. FINDINGS: Mild medial joint space narrowing is noted. No acute fracture or dislocation is seen. No gross soft tissue abnormality is noted. IMPRESSION: Mild degenerative changes without acute abnormality. Electronically Signed   By: Inez Catalina M.D.   On:  09/27/2015 16:17     Visual Acuity Review  Right Eye Distance:   Left Eye Distance:   Bilateral Distance:    Right Eye Near:   Left Eye Near:    Bilateral Near:       Review of XR with father:  MDM   1. Knee pain, acute, left     Patient is reassured that there are no issues that require transfer to higher level of care at this time or additional tests. Patient is advised to continue home symptomatic treatment. Patient is advised that if there are new or worsening symptoms to attend the emergency department, contact primary care provider, or return to UC. Instructions of care provided discharged home in stable condition. Return to work/school note provided.   THIS NOTE WAS GENERATED USING A VOICE RECOGNITION SOFTWARE PROGRAM. ALL REASONABLE EFFORTS  WERE MADE TO PROOFREAD THIS DOCUMENT FOR ACCURACY.  I have verbally reviewed the discharge instructions with the patient. A printed AVS was given to the patient.  All questions were answered prior to discharge.      Konrad Felix, [place] 09/27/15 1626

## 2015-09-27 NOTE — Discharge Instructions (Signed)
How to Use a Knee Brace °A knee brace is a device that you wear to support your knee, especially if the knee is healing after an injury or surgery. There are several types of knee braces. Some are designed to prevent an injury (prophylactic brace). These are often worn during sports. Others support an injured knee (functional brace) or keep it still while it heals (rehabilitative brace). People with severe arthritis of the knee may benefit from a brace that takes some pressure off the knee (unloader brace). Most knee braces are made from a combination of cloth and metal or plastic.  °You may need to wear a knee brace to: °· Relieve knee pain. °· Help your knee support your weight (improve stability). °· Help you walk farther (improve mobility). °· Prevent injury. °· Support your knee while it heals from surgery or from an injury. °RISKS AND COMPLICATIONS °Generally, knee braces are very safe to wear. However, problems may occur, including: °· Skin irritation that may lead to infection. °· Making your condition worse if you wear the brace in the wrong way. °HOW TO USE A KNEE BRACE °Different braces will have different instructions for use. Your health care provider will tell you or show you: °· How to put on your brace. °· How to adjust the brace. °· When and how often to wear the brace. °· How to remove the brace. °· If you will need any assistive devices in addition to the brace, such as crutches or a cane. °In general, your brace should: °· Have the hinge of the brace line up with the bend of your knee. °· Have straps, hooks, or tapes that fasten snugly around your leg. °· Not feel too tight or too loose.  °HOW TO CARE FOR A KNEE BRACE °· Check your brace often for signs of damage, such as loose connections or attachments. Your knee brace may get damaged or wear out during normal use. °· Wash the fabric parts of your brace with soap and water. °· Read the insert that comes with your brace for other specific care  instructions.  °SEEK MEDICAL CARE IF: °· Your knee brace is too loose or too tight and you cannot adjust it. °· Your knee brace causes skin redness, swelling, bruising, or irritation. °· Your knee brace is not helping. °· Your knee brace is making your knee pain worse. °  °This information is not intended to replace advice given to you by your health care provider. Make sure you discuss any questions you have with your health care provider. °  °Document Released: 09/15/2003 Document Revised: 03/16/2015 Document Reviewed: 10/18/2014 °Elsevier Interactive Patient Education ©2016 [employer]vier Inc[employer] ° °Heat Therapy °Heat therapy can help ease sore, stiff, injured, and tight muscles and joints. Heat relaxes your muscles, which may help ease your pain. Heat therapy should only be used on old, pre-existing, or long-lasting (chronic) injuries. Do not use heat therapy unless told by your doctor. °HOW TO USE HEAT THERAPY °There are several different kinds of heat therapy, including: °· Moist heat pack. °· Warm water bath. °· Hot water bottle. °· Electric heating pad. °· Heated gel pack. °· Heated wrap. °· Electric heating pad. °GENERAL HEAT THERAPY RECOMMENDATIONS  °· Do not sleep while using heat therapy. Only use heat therapy while you are awake. °· Your skin may turn pink while using heat therapy. Do not use heat therapy if your skin turns red. °· Do not use heat therapy if you have new pain. °· High heat   or long exposure to heat can cause burns. Be careful when using heat therapy to avoid burning your skin. °· Do not use heat therapy on areas of your skin that are already irritated, such as with a rash or sunburn. °GET HELP IF:  °· You have blisters, redness, swelling (puffiness), or numbness. °· You have new pain. °· Your pain is worse. °MAKE SURE YOU: °· Understand these instructions. °· Will watch your condition. °· Will get help right away if you are not doing well or get worse. °  °This information is not intended to  replace advice given to you by your health care provider. Make sure you discuss any questions you have with your health care provider. °  °Document Released: 09/17/2011 Document Revised: 07/16/2014 Document Reviewed: 08/18/2013 °Elsevier Interactive Patient Education ©2016 Elsevier Inc. ° °

## 2015-09-27 NOTE — ED Notes (Signed)
Pt has had left knee pain for 2 weeks which is not getting any better Pt tried topical cream to relieve the pain but did not work Pt here with father whose speaking for pt

## 2015-10-26 DIAGNOSIS — E784 Other hyperlipidemia: Secondary | ICD-10-CM | POA: Diagnosis not present

## 2015-10-26 DIAGNOSIS — E559 Vitamin D deficiency, unspecified: Secondary | ICD-10-CM | POA: Diagnosis not present

## 2015-10-26 DIAGNOSIS — M25562 Pain in left knee: Secondary | ICD-10-CM | POA: Diagnosis not present

## 2015-10-26 DIAGNOSIS — H6123 Impacted cerumen, bilateral: Secondary | ICD-10-CM | POA: Diagnosis not present

## 2015-10-26 DIAGNOSIS — Z0000 Encounter for general adult medical examination without abnormal findings: Secondary | ICD-10-CM | POA: Diagnosis not present

## 2015-10-26 DIAGNOSIS — I10 Essential (primary) hypertension: Secondary | ICD-10-CM | POA: Diagnosis not present

## 2015-10-26 DIAGNOSIS — E1165 Type 2 diabetes mellitus with hyperglycemia: Secondary | ICD-10-CM | POA: Diagnosis not present

## 2015-10-26 DIAGNOSIS — R350 Frequency of micturition: Secondary | ICD-10-CM | POA: Diagnosis not present

## 2016-01-17 DIAGNOSIS — M25562 Pain in left knee: Secondary | ICD-10-CM | POA: Diagnosis not present

## 2016-01-17 DIAGNOSIS — E1165 Type 2 diabetes mellitus with hyperglycemia: Secondary | ICD-10-CM | POA: Diagnosis not present

## 2016-01-17 DIAGNOSIS — E784 Other hyperlipidemia: Secondary | ICD-10-CM | POA: Diagnosis not present

## 2016-01-17 DIAGNOSIS — I10 Essential (primary) hypertension: Secondary | ICD-10-CM | POA: Diagnosis not present

## 2016-01-17 DIAGNOSIS — R21 Rash and other nonspecific skin eruption: Secondary | ICD-10-CM | POA: Diagnosis not present

## 2016-02-17 DIAGNOSIS — M25562 Pain in left knee: Secondary | ICD-10-CM | POA: Diagnosis not present

## 2016-03-02 DIAGNOSIS — M25562 Pain in left knee: Secondary | ICD-10-CM | POA: Diagnosis not present

## 2016-04-17 IMAGING — DX DG KNEE COMPLETE 4+V*L*
4 series · 4 of 4 positions shown · non-contrast
Comparison: None.

CLINICAL DATA: Left knee pain, initial encounter

EXAM:
LEFT KNEE - COMPLETE 4+ VIEW

[knee ap]
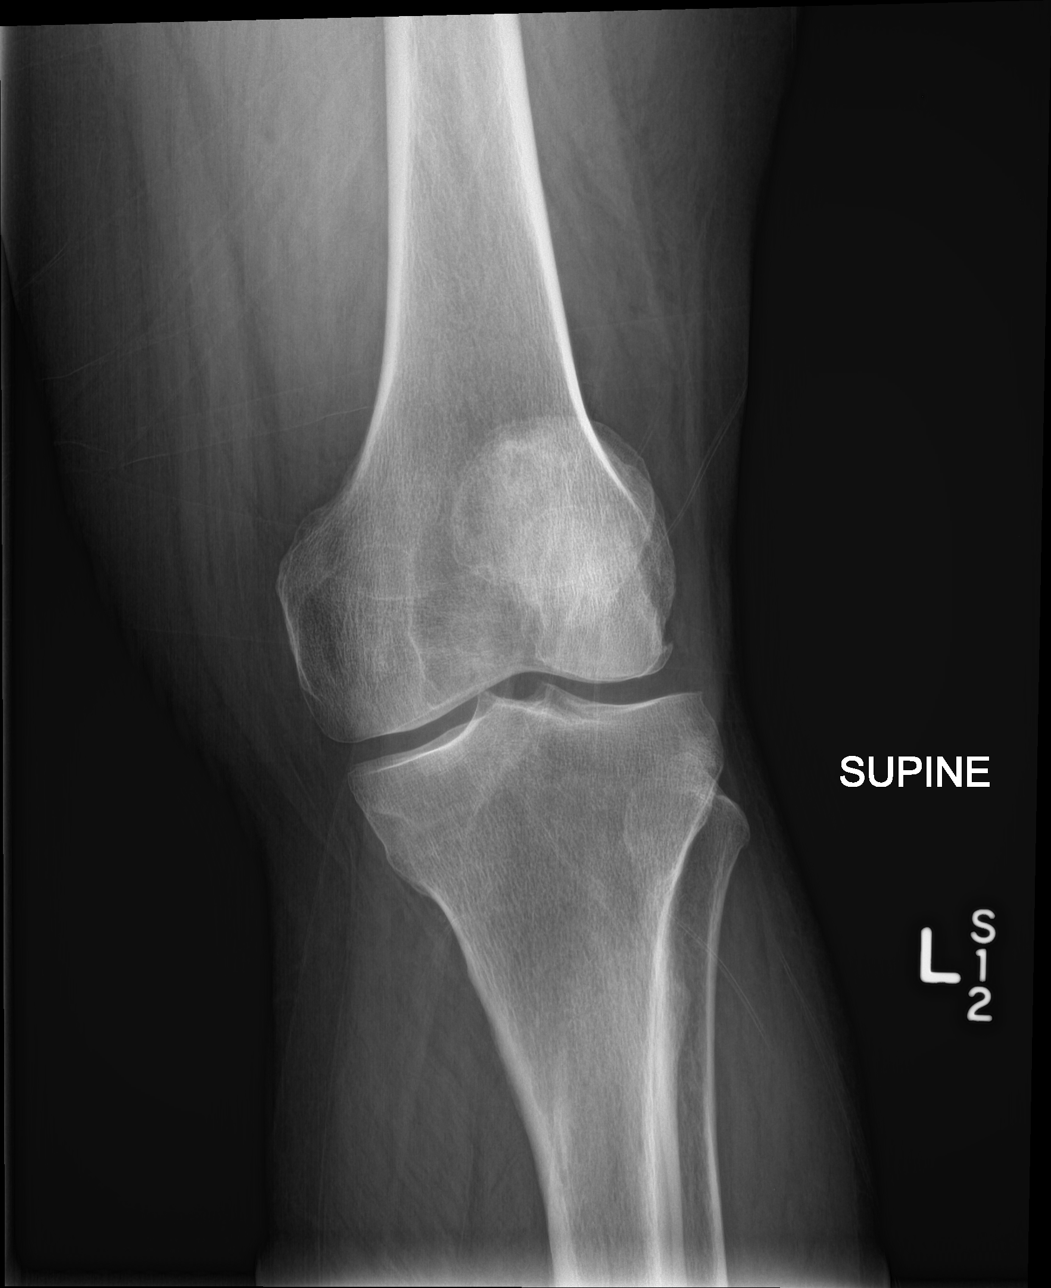

[knee obl (1 of 2)]
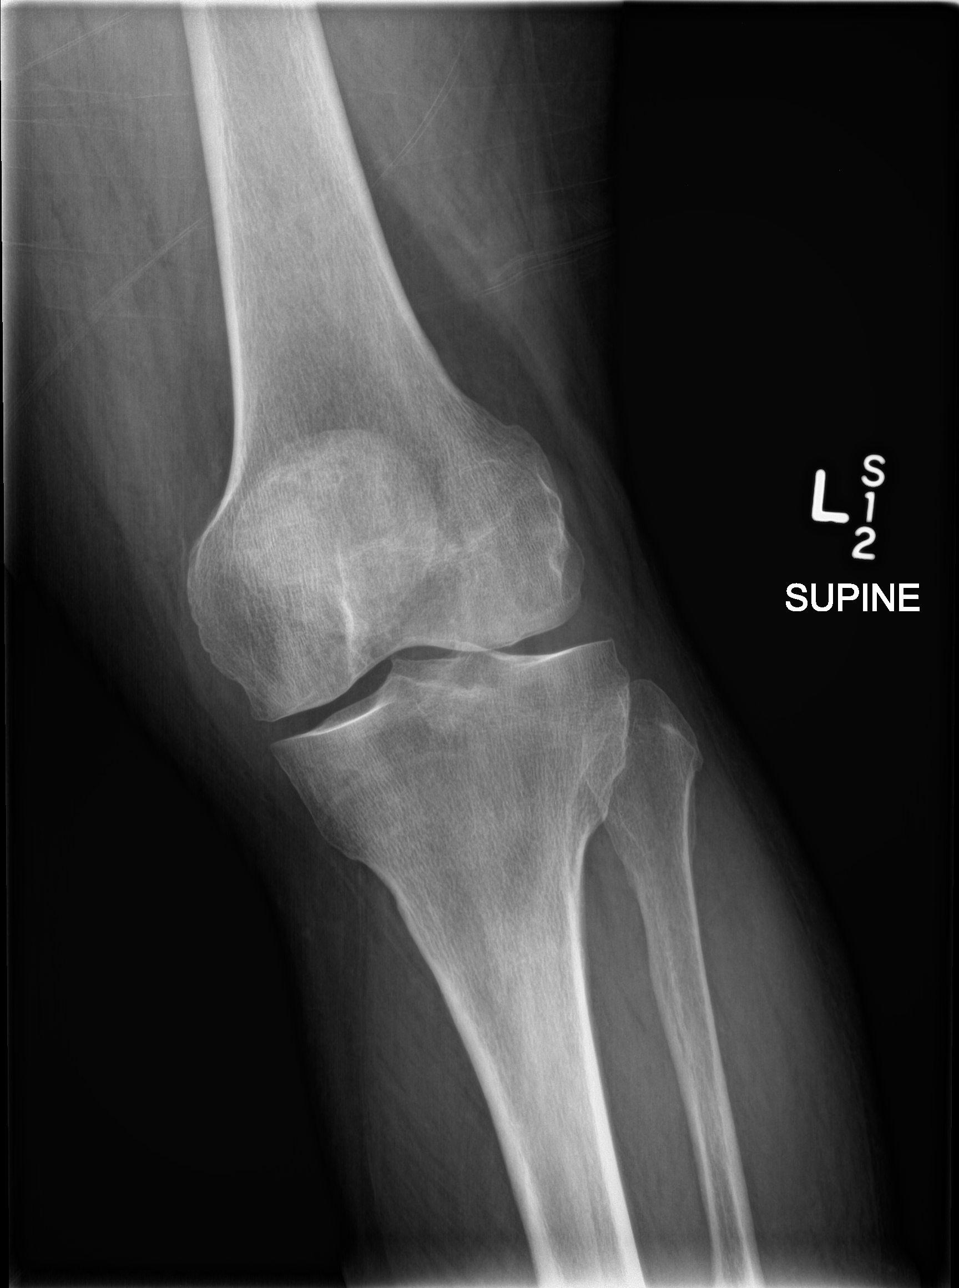

[knee obl (2 of 2)]
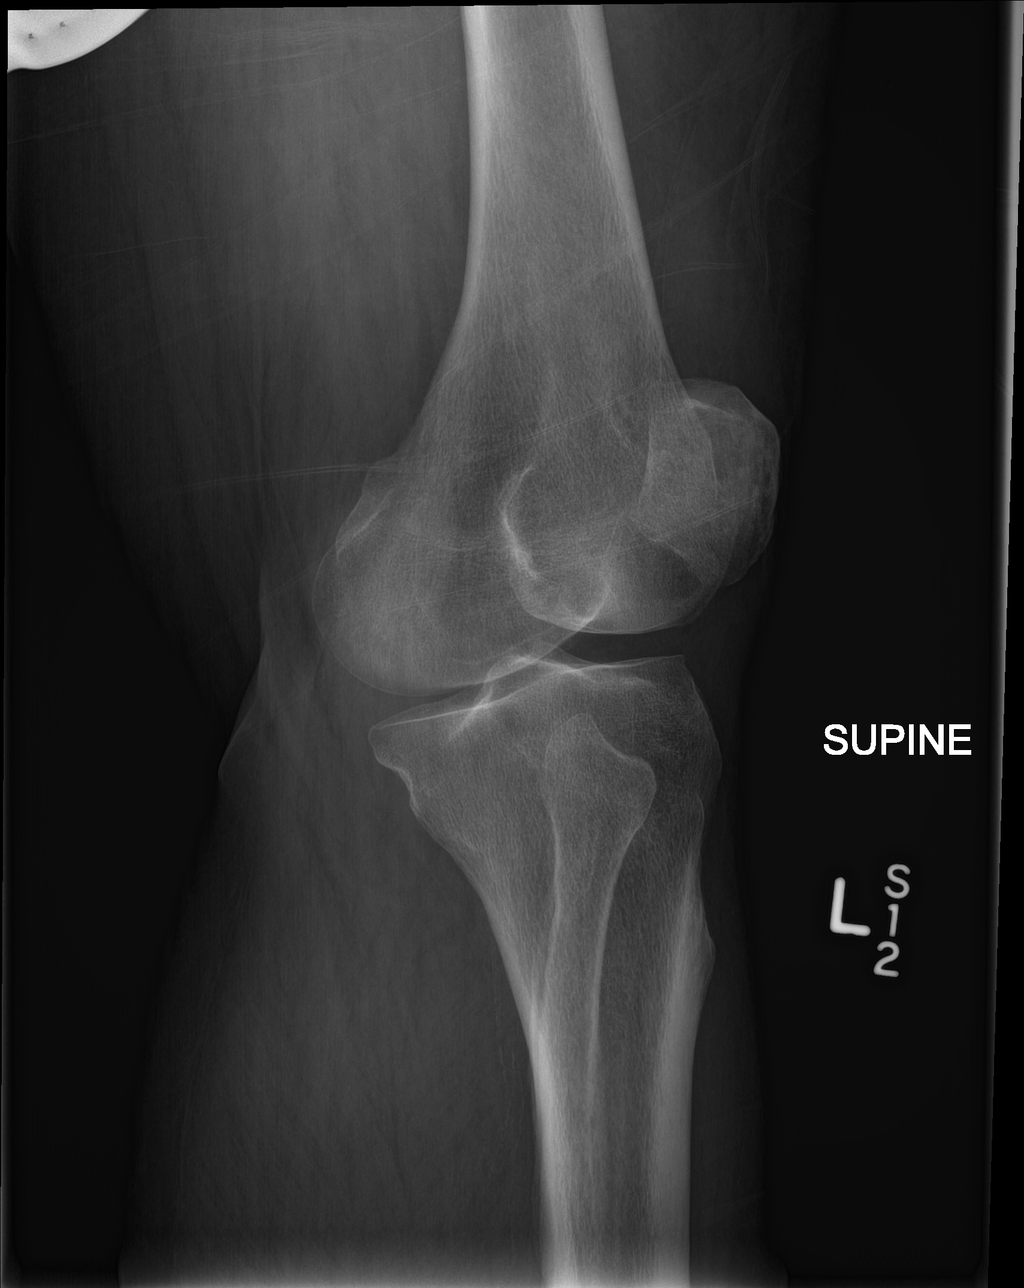

[knee lat]
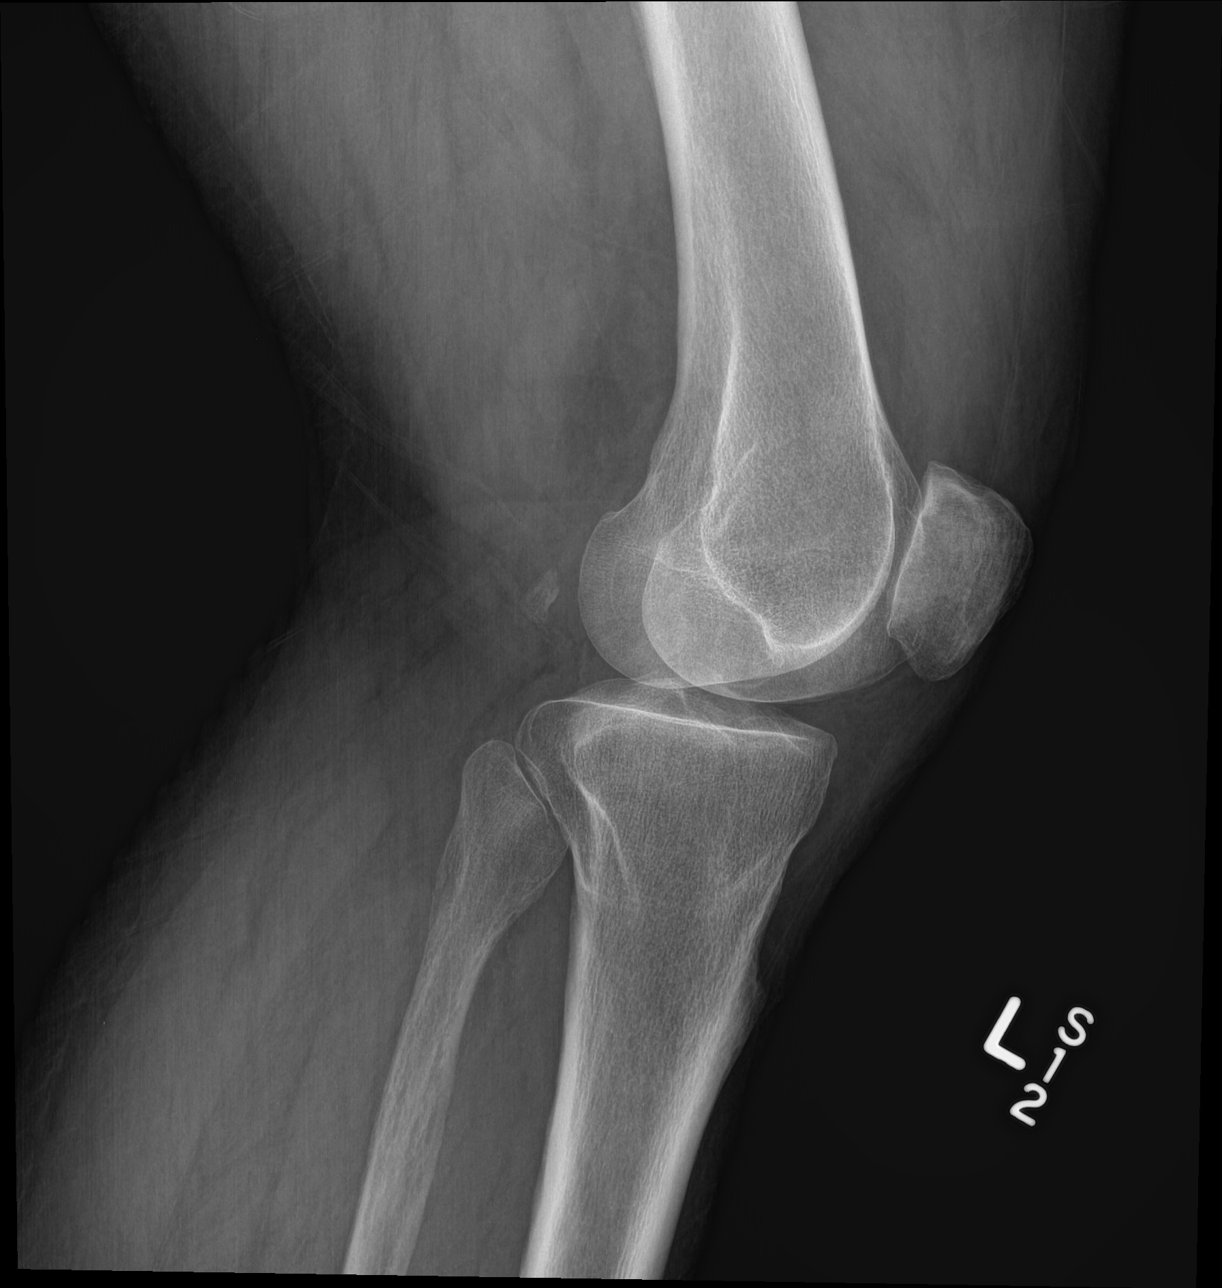

[4 of 4 positions shown; findings below may reference images not displayed]

FINDINGS: Mild medial joint space narrowing is noted. No acute fracture or
dislocation is seen. No gross soft tissue abnormality is noted.
IMPRESSION: Mild degenerative changes without acute abnormality.

## 2016-04-26 DIAGNOSIS — E1165 Type 2 diabetes mellitus with hyperglycemia: Secondary | ICD-10-CM | POA: Diagnosis not present

## 2016-04-26 DIAGNOSIS — I10 Essential (primary) hypertension: Secondary | ICD-10-CM | POA: Diagnosis not present

## 2016-04-26 DIAGNOSIS — Z23 Encounter for immunization: Secondary | ICD-10-CM | POA: Diagnosis not present

## 2016-04-26 DIAGNOSIS — E784 Other hyperlipidemia: Secondary | ICD-10-CM | POA: Diagnosis not present

## 2016-06-28 DIAGNOSIS — K089 Disorder of teeth and supporting structures, unspecified: Secondary | ICD-10-CM | POA: Insufficient documentation

## 2016-06-28 DIAGNOSIS — E119 Type 2 diabetes mellitus without complications: Secondary | ICD-10-CM | POA: Diagnosis not present

## 2016-06-28 DIAGNOSIS — I10 Essential (primary) hypertension: Secondary | ICD-10-CM | POA: Diagnosis not present

## 2016-06-28 DIAGNOSIS — K029 Dental caries, unspecified: Secondary | ICD-10-CM | POA: Diagnosis not present

## 2017-06-13 DIAGNOSIS — E119 Type 2 diabetes mellitus without complications: Secondary | ICD-10-CM | POA: Diagnosis not present

## 2017-06-13 DIAGNOSIS — I10 Essential (primary) hypertension: Secondary | ICD-10-CM | POA: Diagnosis not present

## 2017-06-13 DIAGNOSIS — E1165 Type 2 diabetes mellitus with hyperglycemia: Secondary | ICD-10-CM | POA: Diagnosis not present

## 2017-09-12 DIAGNOSIS — E119 Type 2 diabetes mellitus without complications: Secondary | ICD-10-CM | POA: Diagnosis not present

## 2017-09-12 DIAGNOSIS — I10 Essential (primary) hypertension: Secondary | ICD-10-CM | POA: Diagnosis not present

## 2017-09-12 DIAGNOSIS — E785 Hyperlipidemia, unspecified: Secondary | ICD-10-CM | POA: Diagnosis not present

## 2017-11-26 ENCOUNTER — Ambulatory Visit (INDEPENDENT_AMBULATORY_CARE_PROVIDER_SITE_OTHER): Payer: Medicare Other | Admitting: Family Medicine

## 2017-11-26 ENCOUNTER — Other Ambulatory Visit: Payer: Self-pay

## 2017-11-26 ENCOUNTER — Encounter: Payer: Self-pay | Admitting: Family Medicine

## 2017-11-26 VITALS — BP 138/72 | HR 110 | Temp 98.0°F | Resp 16 | Ht 63.78 in | Wt 235.0 lb

## 2017-11-26 DIAGNOSIS — I10 Essential (primary) hypertension: Secondary | ICD-10-CM | POA: Diagnosis not present

## 2017-11-26 DIAGNOSIS — E782 Mixed hyperlipidemia: Secondary | ICD-10-CM

## 2017-11-26 DIAGNOSIS — F840 Autistic disorder: Secondary | ICD-10-CM | POA: Diagnosis not present

## 2017-11-26 DIAGNOSIS — Z23 Encounter for immunization: Secondary | ICD-10-CM | POA: Diagnosis not present

## 2017-11-26 DIAGNOSIS — E119 Type 2 diabetes mellitus without complications: Secondary | ICD-10-CM

## 2017-11-26 NOTE — Patient Instructions (Addendum)
1. Please discontinue metformin  BID. Continue with actos/metformin combo pill and jardiance for diabetes.  2. Please investigate with your eye doctor with a diabetic retinal exam would be possible with your son. Let me know if I need to make a referral.

## 2017-11-26 NOTE — Progress Notes (Signed)
5/21/20191:36 PM  Julian Reyes 1961-07-08, 57 y.o. male [id number]  Chief Complaint  Patient presents with  . Establish Care    HPI:   Patient is a 57 y.o. male with past medical history significant for autism, HTN and DM2 who presents today for to establish care  Dr Tye Savoy, previous PCP, not under insurance network anymore  Mother died last year, now lives with his father. Has 2 younger brothers. Both live in [place]. Participates in day program, five days a week. If father unable to care for him, then his brother would take care of him Father has no behavior concerns Likes to eat junk food, unable to sit through meals Sleeps well, no toileting issues.  Checks fasting cbgs. this morning 150, which is his average Has been taking metformin and metformin with actos twice a day He also takes jardiance Has never seen a eye doctor Unsure if he has had pneumonia vaccine Unable to do colon cancer screening Weight stable History obtained from father, patient is nonverbal  Fall Risk  11/26/2017  Falls in the past year? No     Depression screen PHQ 2/9 11/26/2017  Decreased Interest 0  Down, Depressed, Hopeless 0  PHQ - 2 Score 0    Allergies  Allergen Reactions  . Penicillins Rash    Prior to Admission medications   Medication Sig Start Date End Date Taking? Authorizing Provider  ACCU-CHEK AVIVA PLUS test strip  09/10/17  Yes [provider]  ACCU-CHEK FASTCLIX LANCETS [place]  09/05/17  Yes [provider]  amLODipine (NORVASC) 10 MG tablet Take 10 mg by mouth every morning.    Yes [provider]  atorvastatin (LIPITOR) 40 MG tablet Take 40 mg by mouth every morning.    Yes [provider]  JARDIANCE 10 MG TABS tablet  10/21/17  Yes [provider]  losartan-hydrochlorothiazide (HYZAAR) 50-12.5 MG tablet Take by mouth.   Yes [provider]  metFORMIN (GLUCOPHAGE) 500 MG tablet Take by mouth.   Yes [provider]    Omega-3 Fatty Acids (FISH OIL) 1200 MG CAPS Take 1 capsule by mouth 2 (two) times daily.    Yes [provider]  pioglitazone-metformin (ACTOPLUS MET) 15-850 MG per tablet Take 1 tablet by mouth 2 (two) times daily with a meal.    Yes [provider]  potassium citrate (UROCIT-K) 10 MEQ (1080 MG) SR tablet Take by mouth.   Yes [provider]    Past Medical History:  Diagnosis Date  . Autism 07-18-11   pt. moans and makes verbal sounds,doesn't talk  . Chronic kidney disease 07-18-11   kidney stones  . Diabetes mellitus     Past Surgical History:  Procedure Laterality Date  . CYSTOSCOPY W/ URETERAL STENT PLACEMENT  07/12/2011   Procedure: CYSTOSCOPY WITH RETROGRADE PYELOGRAM/URETERAL STENT PLACEMENT;  Surgeon: Dutch Gray, MD;  Location: [facility];  Service: Urology;  Laterality: Right;  . CYSTOSCOPY W/ URETERAL STENT REMOVAL  08/06/2011   Procedure: CYSTOSCOPY WITH STENT REMOVAL;  Surgeon: Dutch Gray, MD;  Location: [facility];  Service: Urology;  Laterality: Right;  Cystscopy with Right Ureteral Stent Removal   . CYSTOSCOPY/RETROGRADE/URETEROSCOPY/STONE EXTRACTION WITH BASKET  07/19/2011   Procedure: CYSTOSCOPY/RETROGRADE/URETEROSCOPY/STONE EXTRACTION WITH BASKET;  Surgeon: Dutch Gray, MD;  Location: [facility];  Service: Urology;  Laterality: Right;  . ingrown toenails      Social History   Tobacco Use  . Smoking status: Never Smoker  . Smokeless tobacco: Never Used  Substance Use Topics  . Alcohol use: No    History reviewed. No pertinent family history.  Review of Systems  Constitutional: Negative for chills and fever.  Respiratory: Negative for cough and shortness of breath.   Cardiovascular: Negative for chest pain, palpitations and leg swelling.  Gastrointestinal: Negative for abdominal pain, nausea and vomiting.     OBJECTIVE:  Blood pressure 138/72, pulse (!) 110, temperature 98 F (36.7 C), temperature source Oral, resp. rate 16, height 5' 3.78"  (1.62 m), weight 235 lb (106.6 kg), SpO2 97 %.  Physical Exam  Constitutional: He appears well-developed and well-nourished.  HENT:  Head: Normocephalic and atraumatic.  Mouth/Throat: Oropharynx is clear and moist.  Eyes: Pupils are equal, round, and reactive to light. EOM are normal.  Neck: Neck supple.  Cardiovascular: Normal rate and regular rhythm. Exam reveals no gallop and no friction rub.  No murmur heard. Pulmonary/Chest: Effort normal and breath sounds normal. He has no wheezes. He has no rales.  Neurological: He is alert.  Skin: Skin is warm and dry.  Psychiatric:  Paces and soft groaning during visit but cooperative with exam  Nursing note and vitals reviewed.   ASSESSMENT and PLAN  1. Type 2 diabetes mellitus without complication, without long-term current use of insulin (HCC) Dc metformin 572m BID. Cont with actos/metformin and jardiance. Checking a1c today. Father will ask his eye doctor if he thinks a retinal exam can be done on his son. Pneumovax 23 today. Father declines most HCM. Father will call when he needs refills. - Hemoglobin A1c - TSH - Microalbumin/Creatinine Ratio, Urine - Pneumococcal polysaccharide vaccine 23-valent greater than or equal to 2yo subcutaneous/IM  2. HTN (hypertension), benign At goal, cont current meds - CBC - Comprehensive metabolic panel  3. Autism disorder Stable, involved in day program, father has no behavioral concerns at this time. Long term living arrangements made among family members.  4. Mixed hyperlipidemia Checking lipids today - Lipid panel    Return in about 6 months (around 05/29/2018).    IRutherford Guys MD [facility] [place] [id number].  [phone] [phone]

## 2017-11-27 LAB — LIPID PANEL
Chol/HDL Ratio: 4.4 ratio (ref 0.0–5.0)
Cholesterol, Total: 160 mg/dL (ref 100–199)
HDL: 36 mg/dL — ABNORMAL LOW (ref >39)
LDL Calculated: 82 mg/dL (ref 0–99)
Triglycerides: 209 mg/dL — ABNORMAL HIGH (ref 0–149)
VLDL Cholesterol Cal: 42 mg/dL — ABNORMAL HIGH (ref 5–40)

## 2017-11-27 LAB — COMPREHENSIVE METABOLIC PANEL
ALT: 22 IU/L (ref 0–44)
AST: 19 IU/L (ref 0–40)
Albumin/Globulin Ratio: 1.9 (ref 1.2–2.2)
Albumin: 4.8 g/dL (ref 3.5–5.5)
Alkaline Phosphatase: 103 IU/L (ref 39–117)
BUN/Creatinine Ratio: 10 (ref 9–20)
BUN: 11 mg/dL (ref 6–24)
Bilirubin Total: 0.5 mg/dL (ref 0.0–1.2)
CO2: 20 mmol/L (ref 20–29)
Calcium: 9.7 mg/dL (ref 8.7–10.2)
Chloride: 102 mmol/L (ref 96–106)
Creatinine, Ser: 1.14 mg/dL (ref 0.76–1.27)
GFR calc Af Amer: 81 mL/min/{1.73_m2} (ref >59)
GFR calc non Af Amer: 70 mL/min/{1.73_m2} (ref >59)
Globulin, Total: 2.5 g/dL (ref 1.5–4.5)
Glucose: 130 mg/dL — ABNORMAL HIGH (ref 65–99)
Potassium: 4.6 mmol/L (ref 3.5–5.2)
Sodium: 143 mmol/L (ref 134–144)
Total Protein: 7.3 g/dL (ref 6.0–8.5)

## 2017-11-27 LAB — CBC
Hematocrit: 44.8 % (ref 37.5–51.0)
Hemoglobin: 14.9 g/dL (ref 13.0–17.7)
MCH: 29.1 pg (ref 26.6–33.0)
MCHC: 33.3 g/dL (ref 31.5–35.7)
MCV: 88 fL (ref 79–97)
Platelets: 349 10*3/uL (ref 150–450)
RBC: 5.12 x10E6/uL (ref 4.14–5.80)
RDW: 14.9 % (ref 12.3–15.4)
WBC: 9.8 10*3/uL (ref 3.4–10.8)

## 2017-11-27 LAB — MICROALBUMIN / CREATININE URINE RATIO
Creatinine, Urine: 77.8 mg/dL
Microalb/Creat Ratio: <3.9 mg/g creat (ref 0.0–30.0)
Microalbumin, Urine: <3 ug/mL

## 2017-11-27 LAB — HEMOGLOBIN A1C
Est. average glucose Bld gHb Est-mCnc: 166 mg/dL
Hgb A1c MFr Bld: 7.4 % — ABNORMAL HIGH (ref 4.8–5.6)

## 2017-11-27 LAB — TSH: TSH: 1.92 u[IU]/mL (ref 0.450–4.500)

## 2017-12-07 ENCOUNTER — Encounter: Payer: Self-pay | Admitting: Family Medicine

## 2017-12-19 ENCOUNTER — Telehealth: Payer: Self-pay | Admitting: Family Medicine

## 2017-12-19 MED ORDER — AMLODIPINE BESYLATE 10 MG PO TABS: 10.0000 mg | ORAL_TABLET | ORAL | 0 refills | Status: DC

## 2017-12-19 NOTE — Telephone Encounter (Signed)
Copied from CRM [id number]. Topic: Inquiry >> Dec 19, 2017 11:12 AM Yvonna Alanisobinson, Andra M wrote: Reason for CRM: Patient called requesting a refill of AmLODipine (NORVASC) 10 MG tablet. Patient did contact the pharmacy. The pharmacy sent a request four days ago, but did not receive a response. Patient only has two days of medication remaining. [facility] [address] - [place] ([address]), [place] - [address] [phone] (Phone)  [phone] (Fax).       Thank You!!!

## 2017-12-19 NOTE — Telephone Encounter (Signed)
Refill request for amlodipine 10 MG tab. LOV 11/26/17 medication was addressed. PCP [facility]

## 2018-01-02 ENCOUNTER — Telehealth: Payer: Self-pay | Admitting: Family Medicine

## 2018-01-02 NOTE — Telephone Encounter (Unsigned)
Copied from CRM [id number]. Topic: Quick Communication - See Telephone Encounter >> Jan 02, 2018 10:14 AM Waymon AmatoBurton, Donna F wrote: Pt called checking on status of refill request of losartan and jardiance he states this should have been done last week but nothing was in the chart   [facility] [place]   Best number [phone]

## 2018-01-03 NOTE — Telephone Encounter (Signed)
Jardiance and Hyzaar are both from historical providers.  Message sent to Dr. Leretha PolSantiago to advise

## 2018-01-03 NOTE — Telephone Encounter (Signed)
Refill request for Jardiance and Hyzaar, filled previously by historical provider.   LOV: 11/26/17 [facility] [address]

## 2018-01-07 MED ORDER — PIOGLITAZONE HCL-METFORMIN HCL 15-850 MG PO TABS: 1.0000 | ORAL_TABLET | Freq: Two times a day (BID) | ORAL | 1 refills | Status: DC

## 2018-01-07 MED ORDER — AMLODIPINE BESYLATE 10 MG PO TABS: 10.0000 mg | ORAL_TABLET | ORAL | 1 refills | Status: DC

## 2018-01-07 MED ORDER — JARDIANCE 10 MG PO TABS: 10.0000 mg | ORAL_TABLET | Freq: Every day | ORAL | 1 refills | Status: DC

## 2018-01-07 MED ORDER — ATORVASTATIN CALCIUM 40 MG PO TABS: 40.0000 mg | ORAL_TABLET | ORAL | 1 refills | Status: DC

## 2018-01-07 MED ORDER — LOSARTAN POTASSIUM-HCTZ 50-12.5 MG PO TABS: 1.0000 | ORAL_TABLET | Freq: Every day | ORAL | 1 refills | Status: DC

## 2018-01-07 NOTE — Telephone Encounter (Signed)
Called patient's father. Refilled meds and apologized.

## 2018-01-07 NOTE — Telephone Encounter (Signed)
Pt called to check why the medication has not been filled, pt is needing a phone call and explanation on what needs to be done in order for the pt to have the medication prescribed

## 2018-01-22 ENCOUNTER — Other Ambulatory Visit: Payer: Self-pay

## 2018-01-22 ENCOUNTER — Ambulatory Visit (INDEPENDENT_AMBULATORY_CARE_PROVIDER_SITE_OTHER): Payer: Medicare Other | Admitting: Emergency Medicine

## 2018-01-22 ENCOUNTER — Encounter: Payer: Self-pay | Admitting: Emergency Medicine

## 2018-01-22 VITALS — BP 150/87 | HR 113 | Temp 98.4°F | Resp 16 | Ht 63.5 in | Wt 230.2 lb

## 2018-01-22 DIAGNOSIS — W57XXXA Bitten or stung by nonvenomous insect and other nonvenomous arthropods, initial encounter: Secondary | ICD-10-CM | POA: Insufficient documentation

## 2018-01-22 DIAGNOSIS — L089 Local infection of the skin and subcutaneous tissue, unspecified: Secondary | ICD-10-CM | POA: Diagnosis not present

## 2018-01-22 DIAGNOSIS — F840 Autistic disorder: Secondary | ICD-10-CM | POA: Diagnosis not present

## 2018-01-22 MED ORDER — DOXYCYCLINE HYCLATE 100 MG PO TABS: 100.0000 mg | ORAL_TABLET | Freq: Two times a day (BID) | ORAL | 0 refills | Status: AC

## 2018-01-22 NOTE — Progress Notes (Signed)
Julian Reyes 57 y.o.   Chief Complaint  Patient presents with  . Tick Removal    on the back with redness and pus  01/16/2018 per patient's father     HISTORY OF PRESENT ILLNESS: This is a 57 y.o. male complaining of infected tick bite to right mid back.  Patient has a history of autism and is nonverbal.  History obtained from father.  He says he squeezed pus out of it yesterday.  HPI   Prior to Admission medications   Medication Sig Start Date End Date Taking? Authorizing Provider  amLODipine (NORVASC) 10 MG tablet Take 1 tablet (10 mg total) by mouth every morning. 01/07/18  Yes Rutherford Guys, MD  atorvastatin (LIPITOR) 40 MG tablet Take 1 tablet (40 mg total) by mouth every morning. 01/07/18  Yes Rutherford Guys, MD  JARDIANCE 10 MG TABS tablet Take 10 mg by mouth daily. 01/07/18  Yes Rutherford Guys, MD  losartan-hydrochlorothiazide (HYZAAR) 50-12.5 MG tablet Take 1 tablet by mouth daily. 01/07/18  Yes Rutherford Guys, MD  pioglitazone-metformin (ACTOPLUS MET) [phone] MG tablet Take 1 tablet by mouth 2 (two) times daily with a meal. 01/07/18  Yes Rutherford Guys, MD  potassium citrate (UROCIT-K) 10 MEQ (1080 MG) SR tablet Take by mouth.   Yes [provider]  ACCU-CHEK AVIVA PLUS test strip  09/10/17   [provider]  ACCU-CHEK FASTCLIX LANCETS [place]  09/05/17   [provider]  Omega-3 Fatty Acids (FISH OIL) 1200 MG CAPS Take 1 capsule by mouth 2 (two) times daily.     [provider]    Allergies  Allergen Reactions  . Penicillins Rash    Patient Active Problem List   Diagnosis Date Noted  . Autism disorder 04/05/2014  . Gastroesophageal reflux disease without esophagitis 04/05/2014  . HTN (hypertension), benign 04/05/2014  . Hyperlipidemia 04/05/2014  . Type 2 diabetes mellitus without complication ([place]) 03/88/8280    Past Medical History:  Diagnosis Date  . Autism 07-18-11   pt. moans and makes verbal sounds,doesn't talk  . Chronic  kidney disease 07-18-11   kidney stones  . Diabetes mellitus     Past Surgical History:  Procedure Laterality Date  . CYSTOSCOPY W/ URETERAL STENT PLACEMENT  07/12/2011   Procedure: CYSTOSCOPY WITH RETROGRADE PYELOGRAM/URETERAL STENT PLACEMENT;  Surgeon: Dutch Gray, MD;  Location: [facility];  Service: Urology;  Laterality: Right;  . CYSTOSCOPY W/ URETERAL STENT REMOVAL  08/06/2011   Procedure: CYSTOSCOPY WITH STENT REMOVAL;  Surgeon: Dutch Gray, MD;  Location: [facility];  Service: Urology;  Laterality: Right;  Cystscopy with Right Ureteral Stent Removal   . CYSTOSCOPY/RETROGRADE/URETEROSCOPY/STONE EXTRACTION WITH BASKET  07/19/2011   Procedure: CYSTOSCOPY/RETROGRADE/URETEROSCOPY/STONE EXTRACTION WITH BASKET;  Surgeon: Dutch Gray, MD;  Location: [facility];  Service: Urology;  Laterality: Right;  . ingrown toenails      Social History   Socioeconomic History  . Marital status: Single    Spouse name: Not on file  . Number of children: Not on file  . Years of education: Not on file  . Highest education level: Not on file  Occupational History  . Not on file  Social Needs  . Financial resource strain: Not on file  . Food insecurity:    Worry: Not on file    Inability: Not on file  . Transportation needs:    Medical: Not on file    Non-medical: Not on file  Tobacco Use  . Smoking status: Never Smoker  .  Smokeless tobacco: Never Used  Substance and Sexual Activity  . Alcohol use: No  . Drug use: No  . Sexual activity: Never  Lifestyle  . Physical activity:    Days per week: Not on file    Minutes per session: Not on file  . Stress: Not on file  Relationships  . Social connections:    Talks on phone: Not on file    Gets together: Not on file    Attends religious service: Not on file    Active member of club or organization: Not on file    Attends meetings of clubs or organizations: Not on file    Relationship status: Not on file  . Intimate partner violence:    Fear of current or ex  partner: Not on file    Emotionally abused: Not on file    Physically abused: Not on file    Forced sexual activity: Not on file  Other Topics Concern  . Not on file  Social History Narrative  . Not on file    No family history on file.   Review of Systems  Constitutional: Negative.  Negative for chills and fever.  Respiratory: Negative for shortness of breath.   Gastrointestinal: Negative for nausea and vomiting.  Skin: Positive for rash.  All other systems reviewed and are negative.   Vitals:   01/22/18 1117  BP: (!) 150/87  Pulse: (!) 113  Resp: 16  Temp: 98.4 F (36.9 C)  SpO2: 97%    Physical Exam  Constitutional: He appears well-developed and well-nourished.  HENT:  Head: Normocephalic.  Eyes: Pupils are equal, round, and reactive to light.  Neck: Normal range of motion.  Cardiovascular: Normal rate.  Pulmonary/Chest: Effort normal.  Musculoskeletal: Normal range of motion.  Neurological: He is alert.  Skin: Skin is warm and dry.  Positive infected insect bite to right mid back area.  Vitals reviewed.    ASSESSMENT & PLAN: Sheena was seen today for tick removal.  Diagnoses and all orders for this visit:  Skin infection -     doxycycline (VIBRA-TABS) 100 MG tablet; Take 1 tablet (100 mg total) by mouth 2 (two) times daily for 7 days.  Tick bite, initial encounter  Autism disorder    Patient Instructions       IF you received an x-ray today, you will receive an invoice from [facility]. Please contact [facility] at [phone] with questions or concerns regarding your invoice.   IF you received labwork today, you will receive an invoice from [facility]. Please contact [facility] at [phone] with questions or concerns regarding your invoice.   Our billing staff will not be able to assist you with questions regarding bills from these companies.  You will be contacted with the lab results as soon as they are available. The  fastest way to get your results is to activate your My Chart account. Instructions are located on the last page of this paperwork. If you have not heard from [place] regarding the results in 2 weeks, please contact this office.    Valsartan Tick Bite Information, Adult Ticks are insects that can bite. Most ticks live in shrubs and grassy areas. They climb onto people and animals that go by. Then they bite. Some ticks carry germs that can make you sick. How can I prevent tick bites?  Use an insect repellent that has 20% or higher of the ingredients DEET, picaridin, or IR3535. Put this insect repellent on: ? Bare skin. ?  The tops of your boots. ? Your pant legs. ? The ends of your sleeves.  If you use an insect repellent that has the ingredient permethrin, make sure to follow the instructions on the bottle. Treat the following: ? Clothing. ? Supplies. ? Boots. ? Tents.  Wear long sleeves, long pants, and light colors.  Tuck your pant legs into your socks.  Stay in the middle of the trail.  Try not to walk through long grass.  Before going inside your house, check your clothes, hair, and skin for ticks. Make sure to check your head, neck, armpits, waist, groin, and joint areas.  Check for ticks every day.  When you come indoors: ? Wash your clothes right away. ? Shower right away. ? Dry your clothes in a dryer on high heat for 60 minutes or more. What is the right way to remove a tick? Remove a tick from your skin as soon as possible.  To remove a tick that is crawling on your skin: ? Go outdoors and brush the tick off. ? Use tape or a lint roller.  To remove a tick that is biting: ? Wash your hands. ? If you have latex gloves, put them on. ? Use tweezers, curved forceps, or a tick-removal tool to grasp the tick. Grasp the tick as close to your skin and as close to the tick's head as possible. ? Gently pull up until the tick lets go.  Try to keep the tick's head attached to  its body.  Do not twist or jerk the tick.  Do not squeeze or crush the tick.  Do not try to remove a tick with heat, alcohol, petroleum jelly, or fingernail polish. How should I get rid of a tick? Here are some ways to get rid of a tick that is alive:  Place the tick in rubbing alcohol.  Place the tick in a bag or container you can close tightly.  Wrap the tick tightly in tape.  Flush the tick down the toilet.  Contact a doctor if:  You have symptoms of a disease, such as: ? Pain in a muscle, joint, or bone. ? Trouble walking or moving your legs. ? Numbness in your legs. ? Inability to move (paralysis). ? A red rash that makes a circle (bull's-eye rash). ? Redness and swelling where the tick bit you. ? A fever. ? Throwing up (vomiting) over and over. ? Diarrhea. ? Weight loss. ? Tender and swollen lymph glands. ? Shortness of breath. ? Cough. ? Belly pain (abdominal pain). ? Headache. ? Being more tired than normal. ? A change in how alert (conscious) you are. ? Confusion. Get help right away if:  You cannot remove a tick.  A part of a tick breaks off and gets stuck in your skin.  You are feeling worse. Summary  Ticks may carry germs that can make you sick.  To prevent tick bites, wear long sleeves, long pants, and light colors. Use insect repellent. Follow the instructions on the bottle.  If the tick is biting, do not try to remove it with heat, alcohol, petroleum jelly, or fingernail polish.  Use tweezers, curved forceps, or a tick-removal tool to grasp the tick. Gently pull up until the tick lets go. Do not twist or jerk the tick. Do not squeeze or crush the tick.  If you have symptoms, contact a doctor. This information is not intended to replace advice given to you by your health care provider. Make sure  you discuss any questions you have with your health care provider. Document Released: 09/19/2009 Document Revised: 10/05/2016 Document Reviewed:  10/05/2016 Elsevier Interactive Patient Education  2018 Elsevier Inc.      Agustina Caroli, MD [facility]

## 2018-01-22 NOTE — Patient Instructions (Addendum)
IF you received an x-ray today, you will receive an invoice from [facility]. Please contact [facility] at [phone] with questions or concerns regarding your invoice.   IF you received labwork today, you will receive an invoice from [facility]. Please contact [facility] at [phone] with questions or concerns regarding your invoice.   Our billing staff will not be able to assist you with questions regarding bills from these companies.  You will be contacted with the lab results as soon as they are available. The fastest way to get your results is to activate your My Chart account. Instructions are located on the last page of this paperwork. If you have not heard from us regarding the results in 2 weeks, please contact this office.    Valsartan Tick Bite Information, Adult Ticks are insects that can bite. Most ticks live in shrubs and grassy areas. They climb onto people and animals that go by. Then they bite. Some ticks carry germs that can make you sick. How can I prevent tick bites?  Use an insect repellent that has 20% or higher of the ingredients DEET, picaridin, or IR3535. Put this insect repellent on: ? Bare skin. ? The tops of your boots. ? Your pant legs. ? The ends of your sleeves.  If you use an insect repellent that has the ingredient permethrin, make sure to follow the instructions on the bottle. Treat the following: ? Clothing. ? Supplies. ? Boots. ? Tents.  Wear long sleeves, long pants, and light colors.  Tuck your pant legs into your socks.  Stay in the middle of the trail.  Try not to walk through long grass.  Before going inside your house, check your clothes, hair, and skin for ticks. Make sure to check your head, neck, armpits, waist, groin, and joint areas.  Check for ticks every day.  When you come indoors: ? Wash your clothes right away. ? Shower right away. ? Dry your clothes in a dryer on high heat for 60 minutes or  more. What is the right way to remove a tick? Remove a tick from your skin as soon as possible.  To remove a tick that is crawling on your skin: ? Go outdoors and brush the tick off. ? Use tape or a lint roller.  To remove a tick that is biting: ? Wash your hands. ? If you have latex gloves, put them on. ? Use tweezers, curved forceps, or a tick-removal tool to grasp the tick. Grasp the tick as close to your skin and as close to the tick's head as possible. ? Gently pull up until the tick lets go.  Try to keep the tick's head attached to its body.  Do not twist or jerk the tick.  Do not squeeze or crush the tick.  Do not try to remove a tick with heat, alcohol, petroleum jelly, or fingernail polish. How should I get rid of a tick? Here are some ways to get rid of a tick that is alive:  Place the tick in rubbing alcohol.  Place the tick in a bag or container you can close tightly.  Wrap the tick tightly in tape.  Flush the tick down the toilet.  Contact a doctor if:  You have symptoms of a disease, such as: ? Pain in a muscle, joint, or bone. ? Trouble walking or moving your legs. ? Numbness in your legs. ? Inability to move (paralysis). ? A red rash that makes a circle (  bull's-eye rash). ? Redness and swelling where the tick bit you. ? A fever. ? Throwing up (vomiting) over and over. ? Diarrhea. ? Weight loss. ? Tender and swollen lymph glands. ? Shortness of breath. ? Cough. ? Belly pain (abdominal pain). ? Headache. ? Being more tired than normal. ? A change in how alert (conscious) you are. ? Confusion. Get help right away if:  You cannot remove a tick.  A part of a tick breaks off and gets stuck in your skin.  You are feeling worse. Summary  Ticks may carry germs that can make you sick.  To prevent tick bites, wear long sleeves, long pants, and light colors. Use insect repellent. Follow the instructions on the bottle.  If the tick is biting, do  not try to remove it with heat, alcohol, petroleum jelly, or fingernail polish.  Use tweezers, curved forceps, or a tick-removal tool to grasp the tick. Gently pull up until the tick lets go. Do not twist or jerk the tick. Do not squeeze or crush the tick.  If you have symptoms, contact a doctor. This information is not intended to replace advice given to you by your health care provider. Make sure you discuss any questions you have with your health care provider. Document Released: 09/19/2009 Document Revised: 10/05/2016 Document Reviewed: 10/05/2016 Elsevier Interactive Patient Education  2018 [employer].

## 2018-01-30 ENCOUNTER — Telehealth: Payer: Self-pay | Admitting: *Deleted

## 2018-01-30 NOTE — Telephone Encounter (Signed)
This drug is on backorder. Can not get at this time. Can we call in alternative

## 2018-01-30 NOTE — Telephone Encounter (Signed)
Hi. I assume message refers to losartan-hydrochlorathiazide. I have no documentation of patient having issue with lisinopril or other ACE in the past. Please confirm with patient's father that indeed he has not had any issues and then call in prescription for lisinopril-hydrochlorathizaide 20-12.5mg  1 tablet once a day, 90 tabs with 1 refills. thanks

## 2018-02-18 NOTE — Telephone Encounter (Signed)
Spoke to son his father does not have any allergies as far as he knows. They did end up getting a 2 month supply of Losartan so he is good until September .   Patient son was advised to call pharmacy a week before he runs out to see if it is on back order.  If it is he will call and we can call in the Lisinopril HTC 10-12 5 mg tablet once a day 90 tabs with 1 refill

## 2018-03-06 LAB — HM DIABETES EYE EXAM

## 2018-03-20 ENCOUNTER — Telehealth: Payer: Self-pay | Admitting: Emergency Medicine

## 2018-03-20 ENCOUNTER — Other Ambulatory Visit: Payer: Self-pay | Admitting: Internal Medicine

## 2018-03-20 NOTE — Addendum Note (Signed)
Addended by: Elby BeckPAUL, JANE F on: 03/20/2018 11:56 AM   Modules accepted: Orders

## 2018-03-20 NOTE — Telephone Encounter (Addendum)
Patient has refills of Amlodipine 10 mg. Pharmacy verified.  Refill request: Fastclix lancets       Historical provider     Needs # and sig for Rx   LOV: 01/22/18  [email or web address]  Pharmacy: verified

## 2018-03-20 NOTE — Telephone Encounter (Signed)
Copied from CRM [phone]. Topic: Quick Communication - See Telephone Encounter >> Mar 20, 2018 11:31 AM Mare LoanBurton, Donna F wrote: Pt is very upset that he does not have his amlodipine or the lancet device to check his sugar with   Best number [phone]

## 2018-03-20 NOTE — Telephone Encounter (Signed)
Copied from CRM [phone]. Topic: Quick Communication - See Telephone Encounter >> Mar 20, 2018 11:44 AM Mare LoanBurton, Donna F wrote: Pt is very very upset that he has not gotten his amlodipine and a lancet device to preck his finger with   Best number  [phone]

## 2018-03-21 ENCOUNTER — Other Ambulatory Visit: Payer: Self-pay | Admitting: Family Medicine

## 2018-03-27 NOTE — Telephone Encounter (Signed)
I called [facility] and [facility] to correct issues with patient Rx for Losartan-HCTZ.   But per pharmacy patient need a new Rx for the ACCU-CHEK FASTCLIX LANCETS [place], and lancet devise

## 2018-03-31 ENCOUNTER — Other Ambulatory Visit: Payer: Self-pay | Admitting: *Deleted

## 2018-04-01 ENCOUNTER — Telehealth: Payer: Self-pay | Admitting: Internal Medicine

## 2018-04-01 ENCOUNTER — Other Ambulatory Visit: Payer: Self-pay | Admitting: *Deleted

## 2018-04-01 DIAGNOSIS — E119 Type 2 diabetes mellitus without complications: Secondary | ICD-10-CM

## 2018-04-01 MED ORDER — ACCU-CHEK FASTCLIX LANCETS MISC: 1 refills | Status: DC

## 2018-04-01 NOTE — Telephone Encounter (Signed)
Copied from CRM [id number]. Topic: Quick Communication - See Telephone Encounter >> Apr 01, 2018  2:00 PM Windy KalataMichael, Kaitrin Seybold L, NT wrote: CRM for notification. See Telephone encounter for: 04/01/18.  Patient is calling and states he can not use the Lancets that was called in today because he does not have the device to use it with. Please advise.  [facility] [address] - [place] ([address]), [place] - [address] [place] [address] Phone: [phone] Fax: [phone]

## 2018-04-01 NOTE — Telephone Encounter (Signed)
Rx had been sent to pharmacy.

## 2018-04-02 ENCOUNTER — Other Ambulatory Visit: Payer: Self-pay | Admitting: *Deleted

## 2018-04-02 DIAGNOSIS — E119 Type 2 diabetes mellitus without complications: Secondary | ICD-10-CM

## 2018-04-02 MED ORDER — ACCU-CHEK SOFT TOUCH LANCETS MISC: 12 refills | Status: AC

## 2018-04-02 NOTE — Telephone Encounter (Signed)
PRESCRIPTION SENT.

## 2018-04-02 NOTE — Telephone Encounter (Signed)
Please advise 

## 2018-04-02 NOTE — Telephone Encounter (Signed)
[facility] with additional information/questions about Lancets.   The lancets that pt needs are no longer available (not carried [facility] the [facility]).  They need a new script for Lancets along with a script for a device he can use with the lancets. Selena BattenKim can be reached at [phone]

## 2018-04-11 ENCOUNTER — Other Ambulatory Visit: Payer: Self-pay | Admitting: Nurse Practitioner

## 2018-05-22 ENCOUNTER — Other Ambulatory Visit: Payer: Self-pay | Admitting: Nurse Practitioner

## 2018-05-27 ENCOUNTER — Ambulatory Visit (INDEPENDENT_AMBULATORY_CARE_PROVIDER_SITE_OTHER): Payer: Medicare Other | Admitting: Family Medicine

## 2018-05-27 ENCOUNTER — Encounter: Payer: Self-pay | Admitting: Family Medicine

## 2018-05-27 ENCOUNTER — Other Ambulatory Visit: Payer: Self-pay

## 2018-05-27 VITALS — BP 116/51 | HR 98 | Temp 97.4°F | Ht 63.5 in | Wt 234.6 lb

## 2018-05-27 DIAGNOSIS — E782 Mixed hyperlipidemia: Secondary | ICD-10-CM | POA: Diagnosis not present

## 2018-05-27 DIAGNOSIS — E559 Vitamin D deficiency, unspecified: Secondary | ICD-10-CM | POA: Diagnosis not present

## 2018-05-27 DIAGNOSIS — Z1211 Encounter for screening for malignant neoplasm of colon: Secondary | ICD-10-CM | POA: Diagnosis not present

## 2018-05-27 DIAGNOSIS — Z7984 Long term (current) use of oral hypoglycemic drugs: Secondary | ICD-10-CM

## 2018-05-27 DIAGNOSIS — Z125 Encounter for screening for malignant neoplasm of prostate: Secondary | ICD-10-CM | POA: Diagnosis not present

## 2018-05-27 DIAGNOSIS — E119 Type 2 diabetes mellitus without complications: Secondary | ICD-10-CM

## 2018-05-27 DIAGNOSIS — I10 Essential (primary) hypertension: Secondary | ICD-10-CM

## 2018-05-27 LAB — POCT GLYCOSYLATED HEMOGLOBIN (HGB A1C): Hemoglobin A1C: 8.2 % — AB (ref 4.0–5.6)

## 2018-05-27 MED ORDER — EMPAGLIFLOZIN 25 MG PO TABS: 25.0000 mg | ORAL_TABLET | Freq: Every day | ORAL | 1 refills | Status: DC

## 2018-05-27 NOTE — Patient Instructions (Signed)
° ° ° °  If you have lab work done today you will be contacted with your lab results within the next 2 weeks.  If you have not heard from us then please contact us. The fastest way to get your results is to register for My Chart. ° ° °IF you received an x-ray today, you will receive an invoice from [facility]. Please contact [facility] at [phone] with questions or concerns regarding your invoice.  ° °IF you received labwork today, you will receive an invoice from [facility]. Please contact [facility] at [phone] with questions or concerns regarding your invoice.  ° °Our billing staff will not be able to assist you with questions regarding bills from these companies. ° °You will be contacted with the lab results as soon as they are available. The fastest way to get your results is to activate your My Chart account. Instructions are located on the last page of this paperwork. If you have not heard from us regarding the results in 2 weeks, please contact this office. °  ° ° ° °

## 2018-05-27 NOTE — Progress Notes (Signed)
11/19/20192:30 PM  Julian Reyes [birth year], 57 y.o. male [id number]  Chief Complaint  Patient presents with  . Diabetes    follow up for chronic condition. Father is concerned of vit d levels. Asking for labs to be taken for review    HPI:   Patient is a 57 y.o. male with past medical history significant for autism, HTN and DM2  who presents today for routine followup  Last OV may 2019 Comes in with his father No acute concerns Checks cbgs fasting, 160-170s Denies any lows Cont to participate in day program  Most Recent Immunizations  Administered Date(s) Administered  . Influenza,inj,Quad PF,6+ Mos 05/10/2018  . Pneumococcal Polysaccharide-23 05/10/2018     Lab Results  Component Value Date   HGBA1C 7.4 (H) 11/26/2017   Lab Results  Component Value Date   LDLCALC 82 11/26/2017   CREATININE 1.14 11/26/2017    Fall Risk  05/27/2018 01/22/2018 11/26/2017  Falls in the past year? 0 No No     Depression screen [facility] 2/9 01/22/2018 11/26/2017  Decreased Interest 0 0  Down, Depressed, Hopeless 0 0  PHQ - 2 Score 0 0    Allergies  Allergen Reactions  . Penicillins Rash    Prior to Admission medications   Medication Sig Start Date End Date Taking? Authorizing Provider  amLODipine (NORVASC) 10 MG tablet Take 1 tablet (10 mg total) by mouth every morning. 01/07/18  Yes Rutherford Guys, MD  atorvastatin (LIPITOR) 40 MG tablet Take 1 tablet (40 mg total) by mouth every morning. 01/07/18  Yes Rutherford Guys, MD  glucose blood (ACCU-CHEK AVIVA PLUS) test strip USE 1 STRIP TO CHECK GLUCOSE TWICE DAILY 05/22/18  Yes Minette Brine, FNP  JARDIANCE 10 MG TABS tablet Take 10 mg by mouth daily. 01/07/18  Yes Rutherford Guys, MD  Lancets [facility]) lancets Use as instructed 04/02/18  Yes Rutherford Guys, MD  losartan-hydrochlorothiazide [facility]) 50-12.5 MG tablet Take 1 tablet by mouth daily. 01/07/18  Yes Rutherford Guys, MD  Omega-3 Fatty Acids (FISH OIL) 1200 MG CAPS  Take 1 capsule by mouth 2 (two) times daily.    Yes [provider]  pioglitazone-metformin (ACTOPLUS MET) 15-850 MG tablet Take 1 tablet by mouth 2 (two) times daily with a meal. 01/07/18  Yes Rutherford Guys, MD  potassium citrate (UROCIT-K) 10 MEQ (1080 MG) SR tablet TAKE 1 TABLET BY MOUTH TWICE DAILY 04/14/18  Yes Minette Brine, FNP    Past Medical History:  Diagnosis Date  . Autism 07-18-11   pt. moans and makes verbal sounds,doesn't talk  . Chronic kidney disease 07-18-11   kidney stones  . Diabetes mellitus     Past Surgical History:  Procedure Laterality Date  . CYSTOSCOPY W/ URETERAL STENT PLACEMENT  07/12/2011   Procedure: CYSTOSCOPY WITH RETROGRADE PYELOGRAM/URETERAL STENT PLACEMENT;  Surgeon: Dutch Gray, MD;  Location: [facility];  Service: Urology;  Laterality: Right;  . CYSTOSCOPY W/ URETERAL STENT REMOVAL  08/06/2011   Procedure: CYSTOSCOPY WITH STENT REMOVAL;  Surgeon: Dutch Gray, MD;  Location: [facility];  Service: Urology;  Laterality: Right;  Cystscopy with Right Ureteral Stent Removal   . CYSTOSCOPY/RETROGRADE/URETEROSCOPY/STONE EXTRACTION WITH BASKET  07/19/2011   Procedure: CYSTOSCOPY/RETROGRADE/URETEROSCOPY/STONE EXTRACTION WITH BASKET;  Surgeon: Dutch Gray, MD;  Location: [facility];  Service: Urology;  Laterality: Right;  . ingrown toenails      Social History   Tobacco Use  . Smoking status: Never Smoker  . Smokeless tobacco: Never  Used  Substance Use Topics  . Alcohol use: No    History reviewed. No pertinent family history.  Review of Systems  Unable to perform ROS: Patient nonverbal  father reports patient is fine and has not noticed any changes in health   OBJECTIVE:  Blood pressure (!) 116/51, pulse 98, temperature (!) 97.4 F (36.3 C), temperature source Oral, height 5' 3.5" (1.613 m), weight 234 lb 9.6 oz (106.4 kg), SpO2 96 %. Body mass index is 40.91 kg/m.   Wt Readings from Last 3 Encounters:  05/27/18 234 lb 9.6 oz (106.4 kg)  01/22/18 230  lb 3.2 oz (104.4 kg)  11/26/17 235 lb (106.6 kg)   BP Readings from Last 3 Encounters:  05/27/18 (!) 116/51  01/22/18 (!) 150/87  11/26/17 138/72    Physical Exam  Constitutional: He is oriented to person, place, and time. He appears well-developed and well-nourished.  HENT:  Head: Normocephalic and atraumatic.  Mouth/Throat: Oropharynx is clear and moist.  Eyes: Pupils are equal, round, and reactive to light. Conjunctivae and EOM are normal.  Neck: Neck supple.  Cardiovascular: Normal rate and regular rhythm. Exam reveals no gallop and no friction rub.  No murmur heard. Pulmonary/Chest: Effort normal and breath sounds normal. He has no wheezes. He has no rales.  Musculoskeletal: He exhibits no edema.  Neurological: He is alert and oriented to person, place, and time.  Skin: Skin is warm and dry.  Psychiatric: He has a normal mood and affect.  Nursing note and vitals reviewed.   Results for orders placed or performed in visit on 05/27/18 (from the past 24 hour(s))  POCT glycosylated hemoglobin (Hb A1C)     Status: Abnormal   Collection Time: 05/27/18  2:37 PM  Result Value Ref Range   Hemoglobin A1C 8.2 (A) 4.0 - 5.6 %   HbA1c POC (<> result, manual entry)     HbA1c, POC (prediabetic range)     HbA1c, POC (controlled diabetic range)      ASSESSMENT and PLAN  1. Type 2 diabetes mellitus without complication, without long-term current use of insulin (HCC) Uncontrolled, increasing jardiance. Reviewed r/se/b. - POCT glycosylated hemoglobin (Hb A1C) - Lipid panel - TSH - CMP14+EGFR  2. Screening for prostate cancer - PSA  3. Long term (current) use of oral hypoglycemic drugs - VITAMIN D 25 Hydroxy (Vit-D Deficiency, Fractures)  4. HTN (hypertension), benign Controlled. Continue current regime.   5. Mixed hyperlipidemia Checking labs today, medications will be adjusted as needed.   Other orders - empagliflozin (JARDIANCE) 25 MG TABS tablet; Take 25 mg by mouth  daily.   Return in about 6 months (around 11/25/2018) for chronic medical conditions.    Rutherford Guys, MD Primary Care at [place], [place] [address] Ph.  [phone] Fax [phone]

## 2018-05-28 LAB — CMP14+EGFR
ALT: 22 IU/L (ref 0–44)
AST: 16 IU/L (ref 0–40)
Albumin/Globulin Ratio: 1.8 (ref 1.2–2.2)
Albumin: 4.8 g/dL (ref 3.5–5.5)
Alkaline Phosphatase: 126 IU/L — ABNORMAL HIGH (ref 39–117)
BUN/Creatinine Ratio: 11 (ref 9–20)
BUN: 12 mg/dL (ref 6–24)
Bilirubin Total: 0.5 mg/dL (ref 0.0–1.2)
CO2: 22 mmol/L (ref 20–29)
Calcium: 10 mg/dL (ref 8.7–10.2)
Chloride: 96 mmol/L (ref 96–106)
Creatinine, Ser: 1.05 mg/dL (ref 0.76–1.27)
GFR calc Af Amer: 89 mL/min/{1.73_m2} (ref >59)
GFR calc non Af Amer: 77 mL/min/{1.73_m2} (ref >59)
Globulin, Total: 2.6 g/dL (ref 1.5–4.5)
Glucose: 151 mg/dL — ABNORMAL HIGH (ref 65–99)
Potassium: 4.2 mmol/L (ref 3.5–5.2)
Sodium: 138 mmol/L (ref 134–144)
Total Protein: 7.4 g/dL (ref 6.0–8.5)

## 2018-05-28 LAB — TSH: TSH: 1.76 u[IU]/mL (ref 0.450–4.500)

## 2018-05-28 LAB — LIPID PANEL
Chol/HDL Ratio: 4.6 ratio (ref 0.0–5.0)
Cholesterol, Total: 170 mg/dL (ref 100–199)
HDL: 37 mg/dL — ABNORMAL LOW (ref >39)
LDL Calculated: 76 mg/dL (ref 0–99)
Triglycerides: 287 mg/dL — ABNORMAL HIGH (ref 0–149)
VLDL Cholesterol Cal: 57 mg/dL — ABNORMAL HIGH (ref 5–40)

## 2018-05-28 LAB — VITAMIN D 25 HYDROXY (VIT D DEFICIENCY, FRACTURES): Vit D, 25-Hydroxy: 29.6 ng/mL — ABNORMAL LOW (ref 30.0–100.0)

## 2018-05-28 LAB — PSA: Prostate Specific Ag, Serum: 0.9 ng/mL (ref 0.0–4.0)

## 2018-07-11 ENCOUNTER — Other Ambulatory Visit: Payer: Self-pay | Admitting: Nurse Practitioner

## 2018-07-11 ENCOUNTER — Other Ambulatory Visit: Payer: Self-pay | Admitting: Family Medicine

## 2018-07-17 ENCOUNTER — Other Ambulatory Visit: Payer: Self-pay | Admitting: Family Medicine

## 2018-07-17 NOTE — Telephone Encounter (Signed)
Copied from CRM [id number]. Topic: Quick Communication - Rx Refill/Question >> Jul 17, 2018 12:33 PM Maia Petties wrote: Medication: Potassium Citrate 10 MEQ (1080 MG) - pt down to 2 days - pt states that the pharmacy sent RX since 07/11/2018 - they sent to the wrong doctor (his previous provider)  Has the patient contacted their pharmacy? yes Preferred Pharmacy (with phone number or street name): [facility] [address] ([address] [place] - [address] [phone] (Phone) [phone] (Fax)

## 2018-07-17 NOTE — Telephone Encounter (Signed)
Requested medication (s) are due for refill today: yes  Requested medication (s) are on the active medication list: yes  Last refill:  Last refilled by a different provider  Future visit scheduled: yes  Notes to clinic:  Unable to refill per protocol. Medication last filled by a different provider    Requested Prescriptions  Pending Prescriptions Disp Refills   potassium citrate (UROCIT-K) 10 MEQ (1080 MG) SR tablet 180 tablet 0    Sig: Take 1 tablet (10 mEq total) by mouth 2 (two) times daily.     Endocrinology:  Minerals - Potassium Citrate Failed - 07/17/2018 12:45 PM      Failed - WBC in normal range and within 120 days    WBC  Date Value Ref Range Status  11/26/2017 9.8 3.4 - 10.8 x10E3/uL Final  07/12/2011 16.2 (H) 4.0 - 10.5 K/uL Final         Failed - RBC in normal range and within 120 days    RBC  Date Value Ref Range Status  11/26/2017 5.12 4.14 - 5.80 x10E6/uL Final  07/12/2011 5.12 4.22 - 5.81 MIL/uL Final         Failed - HGB in normal range and within 120 days    Hemoglobin  Date Value Ref Range Status  11/26/2017 14.9 13.0 - 17.7 g/dL Final         Failed - HCT in normal range and within 120 days    Hematocrit  Date Value Ref Range Status  11/26/2017 44.8 37.5 - 51.0 % Final         Failed - PLT in normal range and within 120 days    Platelets  Date Value Ref Range Status  11/26/2017 349 150 - 450 x10E3/uL Final    Comment:                  **Please note reference interval change**         Passed - C02 in normal range and within 120 days    CO2  Date Value Ref Range Status  05/27/2018 22 20 - 29 mmol/L Final         Passed - Cl in normal range and within 120 days    Chloride  Date Value Ref Range Status  05/27/2018 96 96 - 106 mmol/L Final         Passed - Cr in normal range and within 120 days    Creatinine, Ser  Date Value Ref Range Status  05/27/2018 1.05 0.76 - 1.27 mg/dL Final         Passed - K in normal range and within 120  days    Potassium  Date Value Ref Range Status  05/27/2018 4.2 3.5 - 5.2 mmol/L Final         Passed - Na in normal range and within 120 days    Sodium  Date Value Ref Range Status  05/27/2018 138 134 - 144 mmol/L Final         Passed - Valid encounter within last 12 months    Recent Outpatient Visits          1 month ago Type 2 diabetes mellitus without complication, without long-term current use of insulin (HCC)   [facility], Meda Coffee, MD   5 months ago Skin infection   Primary Care at [facility], [place], MD   7 months ago Type 2 diabetes mellitus without complication, without long-term current use [facility]   [facility], Meda CoffeeIrma M, MD      Future Appointments            In 4 months Myles LippsSantiago, Irma M, MD [facility] [place]

## 2018-07-19 ENCOUNTER — Other Ambulatory Visit: Payer: Self-pay | Admitting: Nurse Practitioner

## 2018-07-22 NOTE — Telephone Encounter (Signed)
Pt called to follow up on refill. Would like to know if medication can be refilled. Please advise.

## 2018-07-23 ENCOUNTER — Telehealth: Payer: Self-pay

## 2018-07-23 ENCOUNTER — Other Ambulatory Visit: Payer: Self-pay

## 2018-07-23 DIAGNOSIS — E875 Hyperkalemia: Secondary | ICD-10-CM

## 2018-07-23 MED ORDER — POTASSIUM CITRATE ER 10 MEQ (1080 MG) PO TBCR: 10.0000 meq | EXTENDED_RELEASE_TABLET | Freq: Two times a day (BID) | ORAL | 0 refills | Status: DC

## 2018-07-23 NOTE — Telephone Encounter (Signed)
Sent rx to pharmacy for pt filling potassium. Pt understands that he is to come in for nurse visit for lab draw. Order has been put in.for BMP

## 2018-07-25 ENCOUNTER — Ambulatory Visit (INDEPENDENT_AMBULATORY_CARE_PROVIDER_SITE_OTHER): Payer: Medicare Other | Admitting: Family Medicine

## 2018-07-25 DIAGNOSIS — E875 Hyperkalemia: Secondary | ICD-10-CM | POA: Diagnosis not present

## 2018-07-25 NOTE — Progress Notes (Signed)
Lab visit only. 

## 2018-07-26 LAB — BASIC METABOLIC PANEL
BUN/Creatinine Ratio: 11 (ref 9–20)
BUN: 13 mg/dL (ref 6–24)
CO2: 22 mmol/L (ref 20–29)
Calcium: 10.1 mg/dL (ref 8.7–10.2)
Chloride: 101 mmol/L (ref 96–106)
Creatinine, Ser: 1.2 mg/dL (ref 0.76–1.27)
GFR calc Af Amer: 76 mL/min/{1.73_m2} (ref >59)
GFR calc non Af Amer: 66 mL/min/{1.73_m2} (ref >59)
Glucose: 184 mg/dL — ABNORMAL HIGH (ref 65–99)
Potassium: 4.2 mmol/L (ref 3.5–5.2)
Sodium: 142 mmol/L (ref 134–144)

## 2018-08-14 ENCOUNTER — Telehealth: Payer: Self-pay | Admitting: Family Medicine

## 2018-08-14 NOTE — Telephone Encounter (Signed)
Copied from CRM [id number]. Topic: Appointment Scheduling - Scheduling Inquiry for Clinic >> Aug 14, 2018  3:23 PM Leafy Ro wrote: Reason for YBO:[id number] with [facility] is calling and pt needs to schedule AWV.

## 2018-08-26 ENCOUNTER — Telehealth: Payer: Self-pay | Admitting: *Deleted

## 2018-08-26 NOTE — Telephone Encounter (Signed)
Left detailed message to schedule AWV  Please get day and time patient would like I will see if it is available and call patient to let him know.

## 2018-09-19 ENCOUNTER — Telehealth: Payer: Self-pay | Admitting: Family Medicine

## 2018-09-29 NOTE — Telephone Encounter (Addendum)
Pt left message on refill line and said pharm is out of losartan and he needs another med. [facility] [address]

## 2018-09-29 NOTE — Telephone Encounter (Signed)
Please send in other med losartan is out at pharmacy.

## 2018-09-30 MED ORDER — OLMESARTAN MEDOXOMIL-HCTZ 20-12.5 MG PO TABS: 1.0000 | ORAL_TABLET | Freq: Every day | ORAL | 1 refills | Status: DC

## 2018-09-30 NOTE — Telephone Encounter (Signed)
Please call patient's father and inform him that I sent in a rx for olmesartan-hctz which is comparable to losartan-hctz thanks

## 2018-09-30 NOTE — Addendum Note (Signed)
Addended by: Myles Lipps on: 09/30/2018 01:37 PM   Modules accepted: Orders

## 2018-10-10 ENCOUNTER — Other Ambulatory Visit: Payer: Self-pay | Admitting: Family Medicine

## 2018-10-13 ENCOUNTER — Other Ambulatory Visit: Payer: Self-pay | Admitting: Family Medicine

## 2018-10-17 ENCOUNTER — Other Ambulatory Visit: Payer: Self-pay | Admitting: Family Medicine

## 2018-10-20 ENCOUNTER — Other Ambulatory Visit: Payer: Self-pay | Admitting: Family Medicine

## 2018-11-25 ENCOUNTER — Ambulatory Visit: Payer: Medicare Other | Admitting: Family Medicine

## 2018-11-26 ENCOUNTER — Telehealth (INDEPENDENT_AMBULATORY_CARE_PROVIDER_SITE_OTHER): Payer: Medicare Other | Admitting: Family Medicine

## 2018-11-26 ENCOUNTER — Other Ambulatory Visit: Payer: Self-pay

## 2018-11-26 DIAGNOSIS — E1165 Type 2 diabetes mellitus with hyperglycemia: Secondary | ICD-10-CM | POA: Diagnosis not present

## 2018-11-26 MED ORDER — METFORMIN HCL 1000 MG PO TABS: 1000.0000 mg | ORAL_TABLET | Freq: Two times a day (BID) | ORAL | 3 refills | Status: DC

## 2018-11-26 NOTE — Progress Notes (Signed)
Virtual Visit Note  I connected with patient on 11/26/18 at 230pm by phone and verified that I am speaking with the correct person using two identifiers. Julian Reyes is currently located at home and patient is currently with them during visit. The provider, Rutherford Guys, MD is located in their office at time of visit.  I discussed the limitations, risks, security and privacy concerns of performing an evaluation and management service by telephone and the availability of in person appointments. I also discussed with the patient that there may be a patient responsible charge related to this service. The patient expressed understanding and agreed to proceed.   CC: 6 months followup  HPI ? Patient is a 58 y.o. male with past medical history significant for autism, HTN and DM2 who presents today for routine followup  Last OV Nov 2019 Information provided by father as patient has autism, non-verbal Day program still involved, able to go for rides Checking cbg fasting This morning 158 Previous readings 160, 151, 187 Not taking metformin anymore, unclear why it was stopped Does not check BP at home Has gained weight since being more inside - father wondering about GOLO  Father has no other concerns today  Lab Results  Component Value Date   HGBA1C 8.2 (A) 05/27/2018   HGBA1C 7.4 (H) 11/26/2017   Lab Results  Component Value Date   LDLCALC 76 05/27/2018   CREATININE 1.20 07/25/2018    Allergies  Allergen Reactions  . Penicillins Rash    Prior to Admission medications   Medication Sig Start Date End Date Taking? Authorizing Provider  amLODipine (NORVASC) 10 MG tablet TAKE 1 TABLET BY MOUTH ONCE DAILY IN THE MORNING 07/11/18  Yes Rutherford Guys, MD  atorvastatin (LIPITOR) 40 MG tablet TAKE 1 TABLET BY MOUTH ONCE DAILY IN THE MORNING 07/11/18  Yes Rutherford Guys, MD  empagliflozin (JARDIANCE) 25 MG TABS tablet Take 25 mg by mouth daily. 05/27/18  Yes Rutherford Guys, MD   glucose blood (ACCU-CHEK AVIVA PLUS) test strip USE 1 STRIP TO CHECK GLUCOSE TWICE DAILY 05/22/18  Yes Minette Brine, FNP  Lancets (ACCU-CHEK SOFT TOUCH) lancets Use as instructed 04/02/18  Yes Rutherford Guys, MD  olmesartan-hydrochlorothiazide (BENICAR HCT) 20-12.5 MG tablet Take 1 tablet by mouth daily. 09/30/18  Yes Rutherford Guys, MD  Omega-3 Fatty Acids (FISH OIL) 1200 MG CAPS Take 1 capsule by mouth 2 (two) times daily.    Yes [provider]  pioglitazone-metformin (ACTOPLUS MET) 15-850 MG tablet TAKE 1 TABLET BY MOUTH TWICE DAILY WITH A MEAL 10/10/18  Yes Rutherford Guys, MD  potassium citrate (UROCIT-K) 10 MEQ (1080 MG) SR tablet Take 1 tablet by mouth twice daily 10/10/18  Yes Rutherford Guys, MD    Past Medical History:  Diagnosis Date  . Autism 07-18-11   pt. moans and makes verbal sounds,doesn't talk  . Chronic kidney disease 07-18-11   kidney stones  . Diabetes mellitus     Past Surgical History:  Procedure Laterality Date  . CYSTOSCOPY W/ URETERAL STENT PLACEMENT  07/12/2011   Procedure: CYSTOSCOPY WITH RETROGRADE PYELOGRAM/URETERAL STENT PLACEMENT;  Surgeon: Dutch Gray, MD;  Location[facility];  Service: Urology;  Laterality: Right;  . CYSTOSCOPY W/ URETERAL STENT REMOVAL  08/06/2011   Procedure: CYSTOSCOPY WITH STENT REMOVAL;  Surgeon: Dutch Gray, MD;  Location: [facility];  Service: Urology;  Laterality: Right;  Cystscopy with Right Ureteral Stent Removal   . CYSTOSCOPY/RETROGRADE/URETEROSCOPY/STONE EXTRACTION WITH BASKET  07/19/2011   Procedure: CYSTOSCOPY/RETROGRADE/URETEROSCOPY/STONE EXTRACTION WITH BASKET;  Surgeon: Dutch Gray, MD;  Location: [facility];  Service: Urology;  Laterality: Right;  . ingrown toenails      Social History   Tobacco Use  . Smoking status: Never Smoker  . Smokeless tobacco: Never Used  Substance Use Topics  . Alcohol use: No    No family history on file.  ROS Per hpi  Objective  Vitals as reported by the patient: none   ASSESSMENT  and PLAN  1. Uncontrolled type 2 diabetes mellitus with hyperglycemia (HCC) Uncontrolled. Restarting metformin. Also used to be on actos. Cont jardiance. Reviewed r/se/b.  - Comprehensive metabolic panel; Future - Hemoglobin A1c; Future  Other orders - metFORMIN (GLUCOPHAGE) 1000 MG tablet; Take 1 tablet (1,000 mg total) by mouth 2 (two) times daily with a meal.  FOLLOW-UP: 3 months with labs 3-4 days prior   The above assessment and management plan was discussed with the patient. The patient verbalized understanding of and has agreed to the management plan. Patient is aware to call the clinic if symptoms persist or worsen. Patient is aware when to return to the clinic for a follow-up visit. Patient educated on when it is appropriate to go to the emergency department.    I provided 15 minutes of non-face-to-face time during this encounter.  Rutherford Guys, MD Primary Care at [facility] [place], [place] [address] Ph.  [phone] Fax [phone]

## 2018-11-26 NOTE — Progress Notes (Signed)
Pt states he need a 6 month follow-up for his DM. He states he also need refills.

## 2018-12-12 ENCOUNTER — Other Ambulatory Visit: Payer: Self-pay | Admitting: Family Medicine

## 2018-12-20 ENCOUNTER — Other Ambulatory Visit: Payer: Self-pay | Admitting: Family Medicine

## 2019-01-02 ENCOUNTER — Other Ambulatory Visit: Payer: Self-pay | Admitting: Family Medicine

## 2019-01-02 NOTE — Telephone Encounter (Signed)
Requested medication (s) are due for refill today: no  Requested medication (s) are on the active medication list: no  Last refill: 10/14/2018  Future visit scheduled: yes  Notes to clinic:  Not on medication list    Requested Prescriptions  Pending Prescriptions Disp Refills   pioglitazone-metformin (ACTOPLUS MET) 15-850 MG tablet [Pharmacy Med Name: Pioglitazone HCl-metFORMIN HCl 15-850 MG Oral Tablet] 180 tablet 0    Sig: TAKE 1 TABLET BY MOUTH TWICE DAILY WITH A MEAL     Endocrinology:  Diabetes - Biguanide + Pioglitazone Combo Failed - 01/02/2019 11:08 AM      Failed - HBA1C is between 0 and 7.9 and within 180 days    Hemoglobin A1C  Date Value Ref Range Status  05/27/2018 8.2 (A) 4.0 - 5.6 % Final   Hgb A1c MFr Bld  Date Value Ref Range Status  11/26/2017 7.4 (H) 4.8 - 5.6 % Final    Comment:             Prediabetes: 5.7 - 6.4          Diabetes: >6.4          Glycemic control for adults with diabetes: <7.0          Failed - Valid encounter within last 6 months    Recent Outpatient Visits          5 months ago Hyperkalemia   [facility], Lilia Argue, MD   7 months ago Type 2 diabetes mellitus without complication, without long-term current use of insulin ([place])   [facility] at [facility], Lilia Argue, MD   11 months ago Skin infection   Primary Care at [facility], Ines Bloomer, MD   1 year ago Type 2 diabetes mellitus without complication, without long-term current use of insulin [facility])   [facility] at [facility], Lilia Argue, MD      Future Appointments            In 1 month Rutherford Guys, MD Primary Care at [place], [place] in normal range and within 360 days    Creatinine, Ser  Date Value Ref Range Status  07/25/2018 1.20 0.76 - 1.27 mg/dL Final         Passed - eGFR in normal range and within 360 days    GFR calc Af Amer  Date Value Ref Range Status  07/25/2018 76 >59 mL/min/1.73 Final   GFR calc non Af  Amer  Date Value Ref Range Status  07/25/2018 66 >59 mL/min/1.73 Final         Signed Prescriptions Disp Refills   potassium citrate (UROCIT-K) 10 MEQ (1080 MG) SR tablet 180 tablet 0    Sig: Take 1 tablet by mouth twice daily     Endocrinology:  Minerals - Potassium Citrate Failed - 01/02/2019 11:08 AM      Failed - CO2 in normal range and within 120 days    CO2  Date Value Ref Range Status  07/25/2018 22 20 - 29 mmol/L Final         Failed - Cl in normal range and within 120 days    Chloride  Date Value Ref Range Status  07/25/2018 101 96 - 106 mmol/L Final         Failed - Cr in normal range and within 120 days    Creatinine, Ser  Date Value Ref Range Status  07/25/2018 1.20 0.76 - 1.27 mg/dL Final         Failed - K in normal range and within 120 days    Potassium  Date Value Ref Range Status  07/25/2018 4.2 3.5 - 5.2 mmol/L Final         Failed - Na in normal range and within 120 days    Sodium  Date Value Ref Range Status  07/25/2018 142 134 - 144 mmol/L Final         Failed - WBC in normal range and within 120 days    WBC  Date Value Ref Range Status  11/26/2017 9.8 3.4 - 10.8 x10E3/uL Final  07/12/2011 16.2 (H) 4.0 - 10.5 K/uL Final         Failed - RBC in normal range and within 120 days    RBC  Date Value Ref Range Status  11/26/2017 5.12 4.14 - 5.80 x10E6/uL Final  07/12/2011 5.12 4.22 - 5.81 MIL/uL Final         Failed - HGB in normal range and within 120 days    Hemoglobin  Date Value Ref Range Status  11/26/2017 14.9 13.0 - 17.7 g/dL Final         Failed - HCT in normal range and within 120 days    Hematocrit  Date Value Ref Range Status  11/26/2017 44.8 37.5 - 51.0 % Final         Failed - PLT in normal range and within 120 days    Platelets  Date Value Ref Range Status  11/26/2017 349 150 - 450 x10E3/uL Final    Comment:                  **Please note reference interval change**         Passed - Valid encounter within last 12  months    Recent Outpatient Visits          5 months ago Hyperkalemia   [facility], Lilia Argue, MD   7 months ago Type 2 diabetes mellitus without complication, without long-term current use of insulin ([place])   [facility] at [facility], Lilia Argue, MD   11 months ago Skin infection   Primary Care at [facility], Ines Bloomer, MD   1 year ago Type 2 diabetes mellitus without complication, without long-term current use of insulin [facility])   [facility] at [facility], Lilia Argue, MD      Future Appointments            In 1 month Rutherford Guys, MD Primary Care at [place], [place]            atorvastatin (LIPITOR) 40 MG tablet 90 tablet 0    Sig: TAKE 1 TABLET BY MOUTH ONCE DAILY IN THE MORNING     Cardiovascular:  Antilipid - Statins Failed - 01/02/2019 11:08 AM      Failed - HDL in normal range and within 360 days    HDL  Date Value Ref Range Status  05/27/2018 37 (L) >39 mg/dL Final         Failed - Triglycerides in normal range and within 360 days    Triglycerides  Date Value Ref Range Status  05/27/2018 287 (H) 0 - 149 mg/dL Final         Passed - Total Cholesterol in normal range and within 360 days    Cholesterol, Total  Date Value Ref Range Status  05/27/2018 170  100 - 199 mg/dL Final         Passed - LDL in normal range and within 360 days    LDL Calculated  Date Value Ref Range Status  05/27/2018 76 0 - 99 mg/dL Final         Passed - Patient is not pregnant      Passed - Valid encounter within last 12 months    Recent Outpatient Visits          5 months ago Hyperkalemia   Primary Care at [facility], Lilia Argue, MD   7 months ago Type 2 diabetes mellitus without complication, without long-term current use of insulin [facility])   [facility], Lilia Argue, MD   11 months ago Skin infection   Primary Care at [facility], [place], MD   1 year ago Type 2 diabetes mellitus without complication, without long-term current  use of insulin [facility])   [facility] at [facility], Lilia Argue, MD      Future Appointments            In 1 month Rutherford Guys, MD Primary Care at [place], [facility]

## 2019-01-02 NOTE — Telephone Encounter (Signed)
Requested Prescriptions  Pending Prescriptions Disp Refills  . potassium citrate (UROCIT-K) 10 MEQ (1080 MG) SR tablet [Pharmacy Med Name: Potassium Citrate ER 10 MEQ (1080 MG) Oral Tablet Extended Release] 180 tablet 0    Sig: Take 1 tablet by mouth twice daily     Endocrinology:  Minerals - Potassium Citrate Failed - 01/02/2019 11:08 AM      Failed - CO2 in normal range and within 120 days    CO2  Date Value Ref Range Status  07/25/2018 22 20 - 29 mmol/L Final         Failed - Cl in normal range and within 120 days    Chloride  Date Value Ref Range Status  07/25/2018 101 96 - 106 mmol/L Final         Failed - Cr in normal range and within 120 days    Creatinine, Ser  Date Value Ref Range Status  07/25/2018 1.20 0.76 - 1.27 mg/dL Final         Failed - K in normal range and within 120 days    Potassium  Date Value Ref Range Status  07/25/2018 4.2 3.5 - 5.2 mmol/L Final         Failed - Na in normal range and within 120 days    Sodium  Date Value Ref Range Status  07/25/2018 142 134 - 144 mmol/L Final         Failed - WBC in normal range and within 120 days    WBC  Date Value Ref Range Status  11/26/2017 9.8 3.4 - 10.8 x10E3/uL Final  07/12/2011 16.2 (H) 4.0 - 10.5 K/uL Final         Failed - RBC in normal range and within 120 days    RBC  Date Value Ref Range Status  11/26/2017 5.12 4.14 - 5.80 x10E6/uL Final  07/12/2011 5.12 4.22 - 5.81 MIL/uL Final         Failed - HGB in normal range and within 120 days    Hemoglobin  Date Value Ref Range Status  11/26/2017 14.9 13.0 - 17.7 g/dL Final         Failed - HCT in normal range and within 120 days    Hematocrit  Date Value Ref Range Status  11/26/2017 44.8 37.5 - 51.0 % Final         Failed - PLT in normal range and within 120 days    Platelets  Date Value Ref Range Status  11/26/2017 349 150 - 450 x10E3/uL Final    Comment:                  **Please note reference interval change**         Passed -  Valid encounter within last 12 months    Recent Outpatient Visits          5 months ago Hyperkalemia   [facility], Lilia Argue, MD   7 months ago Type 2 diabetes mellitus without complication, without long-term current use of insulin [facility])   [facility], Lilia Argue, MD   11 months ago Skin infection   Primary Care at [facility], [place], MD   1 year ago Type 2 diabetes mellitus without complication, without long-term current use of insulin [facility])   [facility], Lilia Argue, MD      Future Appointments            In  1 month Rutherford Guys, MD Primary Care at [place], [facility]           . pioglitazone-metformin (ACTOPLUS MET) 15-850 MG tablet [Pharmacy Med Name: Pioglitazone HCl-metFORMIN HCl 15-850 MG Oral Tablet] 180 tablet 0    Sig: TAKE 1 TABLET BY MOUTH TWICE DAILY WITH A MEAL     Endocrinology:  Diabetes - Biguanide + Pioglitazone Combo Failed - 01/02/2019 11:08 AM      Failed - HBA1C is between 0 and 7.9 and within 180 days    Hemoglobin A1C  Date Value Ref Range Status  05/27/2018 8.2 (A) 4.0 - 5.6 % Final   Hgb A1c MFr Bld  Date Value Ref Range Status  11/26/2017 7.4 (H) 4.8 - 5.6 % Final    Comment:             Prediabetes: 5.7 - 6.4          Diabetes: >6.4          Glycemic control for adults with diabetes: <7.0          Failed - Valid encounter within last 6 months    Recent Outpatient Visits          5 months ago Hyperkalemia   [facility], Lilia Argue, MD   7 months ago Type 2 diabetes mellitus without complication, without long-term current use of insulin ([place])   [facility], Lilia Argue, MD   11 months ago Skin infection   Primary Care at [facility], [place], MD   1 year ago Type 2 diabetes mellitus without complication, without long-term current use of insulin [facility]   [facility], Lilia Argue, MD      Future Appointments            In 1 month  Rutherford Guys, MD Primary Care at [place], [place] in normal range and within 360 days    Creatinine, Ser  Date Value Ref Range Status  07/25/2018 1.20 0.76 - 1.27 mg/dL Final         Passed - eGFR in normal range and within 360 days    GFR calc Af Amer  Date Value Ref Range Status  07/25/2018 76 >59 mL/min/1.73 Final   GFR calc non Af Amer  Date Value Ref Range Status  07/25/2018 66 >59 mL/min/1.73 Final         . atorvastatin (LIPITOR) 40 MG tablet [Pharmacy Med Name: Atorvastatin Calcium 40 MG Oral Tablet] 90 tablet 0    Sig: TAKE 1 TABLET BY MOUTH ONCE DAILY IN THE MORNING     Cardiovascular:  Antilipid - Statins Failed - 01/02/2019 11:08 AM      Failed - HDL in normal range and within 360 days    HDL  Date Value Ref Range Status  05/27/2018 37 (L) >39 mg/dL Final         Failed - Triglycerides in normal range and within 360 days    Triglycerides  Date Value Ref Range Status  05/27/2018 287 (H) 0 - 149 mg/dL Final         Passed - Total Cholesterol in normal range and within 360 days    Cholesterol, Total  Date Value Ref Range Status  05/27/2018 170 100 - 199 mg/dL Final         Passed - LDL in normal range and within 360  days    LDL Calculated  Date Value Ref Range Status  05/27/2018 76 0 - 99 mg/dL Final         Passed - Patient is not pregnant      Passed - Valid encounter within last 12 months    Recent Outpatient Visits          5 months ago Hyperkalemia   Primary Care at [facility], Lilia Argue, MD   7 months ago Type 2 diabetes mellitus without complication, without long-term current use of insulin [facility])   [facility], Lilia Argue, MD   11 months ago Skin infection   Primary Care at [facility], [place], MD   1 year ago Type 2 diabetes mellitus without complication, without long-term current use of insulin [facility])   [facility] at [facility], Lilia Argue, MD      Future Appointments            In 1  month Rutherford Guys, MD Primary Care at [place], [facility]

## 2019-01-07 ENCOUNTER — Telehealth: Payer: Self-pay | Admitting: Family Medicine

## 2019-01-14 NOTE — Telephone Encounter (Signed)
Patient calling to check status of medication refill. Advised that the request was denied due to medication not being on current medication list and needing to speak with PCP. Patient would like a call to go over this. States that he has been on this medication for a long time and it has been prescribed by Dr Pamella Pert. Upon looking at medication list, Metformin 1000 MG was called in 11/26/2018, could this have been the replacement medication? Patient states he is out of meication and needs to know something ASAP.

## 2019-01-16 ENCOUNTER — Other Ambulatory Visit: Payer: Self-pay | Admitting: Family Medicine

## 2019-02-23 ENCOUNTER — Other Ambulatory Visit: Payer: Self-pay

## 2019-02-23 ENCOUNTER — Ambulatory Visit (INDEPENDENT_AMBULATORY_CARE_PROVIDER_SITE_OTHER): Payer: Medicare Other | Admitting: Family Medicine

## 2019-02-23 DIAGNOSIS — E1165 Type 2 diabetes mellitus with hyperglycemia: Secondary | ICD-10-CM | POA: Diagnosis not present

## 2019-02-23 LAB — COMPREHENSIVE METABOLIC PANEL
ALT: 28 IU/L (ref 0–44)
AST: 18 IU/L (ref 0–40)
Albumin/Globulin Ratio: 1.9 (ref 1.2–2.2)
Albumin: 4.6 g/dL (ref 3.8–4.9)
Alkaline Phosphatase: 127 IU/L — ABNORMAL HIGH (ref 39–117)
BUN/Creatinine Ratio: 11 (ref 10–24)
BUN: 14 mg/dL (ref 8–27)
Bilirubin Total: 0.3 mg/dL (ref 0.0–1.2)
CO2: 19 mmol/L — ABNORMAL LOW (ref 20–29)
Calcium: 9.9 mg/dL (ref 8.6–10.2)
Chloride: 100 mmol/L (ref 96–106)
Creatinine, Ser: 1.23 mg/dL (ref 0.76–1.27)
GFR calc Af Amer: 73 mL/min/{1.73_m2} (ref >59)
GFR calc non Af Amer: 63 mL/min/{1.73_m2} (ref >59)
Globulin, Total: 2.4 g/dL (ref 1.5–4.5)
Glucose: 213 mg/dL — ABNORMAL HIGH (ref 65–99)
Potassium: 4.2 mmol/L (ref 3.5–5.2)
Sodium: 139 mmol/L (ref 134–144)
Total Protein: 7 g/dL (ref 6.0–8.5)

## 2019-02-23 LAB — HEMOGLOBIN A1C
Est. average glucose Bld gHb Est-mCnc: 160 mg/dL
Hgb A1c MFr Bld: 7.2 % — ABNORMAL HIGH (ref 4.8–5.6)

## 2019-02-26 ENCOUNTER — Ambulatory Visit: Payer: Medicare Other | Admitting: Family Medicine

## 2019-02-27 ENCOUNTER — Other Ambulatory Visit: Payer: Self-pay

## 2019-02-27 ENCOUNTER — Telehealth (INDEPENDENT_AMBULATORY_CARE_PROVIDER_SITE_OTHER): Payer: Medicare Other | Admitting: Family Medicine

## 2019-02-27 ENCOUNTER — Encounter: Payer: Self-pay | Admitting: Family Medicine

## 2019-02-27 DIAGNOSIS — E1165 Type 2 diabetes mellitus with hyperglycemia: Secondary | ICD-10-CM

## 2019-02-27 DIAGNOSIS — L2082 Flexural eczema: Secondary | ICD-10-CM | POA: Diagnosis not present

## 2019-02-27 DIAGNOSIS — F840 Autistic disorder: Secondary | ICD-10-CM

## 2019-02-27 MED ORDER — TRIAMCINOLONE ACETONIDE 0.1 % EX CREA: 1.0000 "application " | TOPICAL_CREAM | Freq: Two times a day (BID) | CUTANEOUS | 1 refills | Status: DC

## 2019-02-27 MED ORDER — PIOGLITAZONE HCL 15 MG PO TABS: 15.0000 mg | ORAL_TABLET | Freq: Every day | ORAL | 1 refills | Status: DC

## 2019-02-27 NOTE — Progress Notes (Signed)
Virtual Visit Note  I connected with patient's father on 02/27/19 at 439pm by phone and verified that I am speaking with the correct person using two identifiers. Julian Reyes is currently located at home and patient is currently with them during visit. The provider, Rutherford Guys, MD is located in their office at time of visit.  I discussed the limitations, risks, security and privacy concerns of performing an evaluation and management service by telephone and the availability of in person appointments. I also discussed with the patient that there may be a patient responsible charge related to this service. The patient expressed understanding and agreed to proceed.   CC: Dm2 and HTN  HPI ? Patient is a58 y.o.malewith past medical history significant for autism, HTN and DM2who presents today for routine followup  Last OV May 2020  Telemedicine Restarted metformin Patient non verbal, history provided by father  Father reports that his son is doing well Tolerating all his medications well Needs refill of actos, ran out about a month ago Checks fasting once a day His glucose has slowly been increasing, today 162 Needs refill of triamcinolone which he uses occasionally for rash in arm pits, specially in the summer Still involved with day program, no sign behavior concerns  Lab Results  Component Value Date   HGBA1C 7.2 (H) 02/23/2019   HGBA1C 8.2 (A) 05/27/2018   HGBA1C 7.4 (H) 11/26/2017   Lab Results  Component Value Date   LDLCALC 76 05/27/2018   CREATININE 1.23 02/23/2019    Allergies  Allergen Reactions  . Penicillins Rash    Prior to Admission medications   Medication Sig Start Date End Date Taking? Authorizing Provider  amLODipine (NORVASC) 10 MG tablet TAKE 1 TABLET BY MOUTH ONCE DAILY IN THE MORNING 12/12/18   Rutherford Guys, MD  atorvastatin (LIPITOR) 40 MG tablet TAKE 1 TABLET BY MOUTH ONCE DAILY IN THE MORNING 01/02/19   Rutherford Guys, MD  glucose  blood (ACCU-CHEK AVIVA PLUS) test strip USE 1 STRIP TO CHECK GLUCOSE TWICE DAILY 05/22/18   Minette Brine, FNP  JARDIANCE 25 MG TABS tablet Take 1 tablet by mouth once daily 12/22/18   Rutherford Guys, MD  Lancets (ACCU-CHEK SOFT [facility]) lancets Use as instructed 04/02/18   Rutherford Guys, MD  metFORMIN (GLUCOPHAGE) 1000 MG tablet Take 1 tablet (1,000 mg total) by mouth 2 (two) times daily with a meal. 11/26/18   Rutherford Guys, MD  olmesartan-hydrochlorothiazide (BENICAR HCT) 20-12.5 MG tablet Take 1 tablet by mouth daily. 09/30/18   Rutherford Guys, MD  Omega-3 Fatty Acids (FISH OIL) 1200 MG CAPS Take 1 capsule by mouth 2 (two) times daily.     [provider]  potassium citrate (UROCIT-K) 10 MEQ (1080 MG) SR tablet Take 1 tablet by mouth twice daily 01/02/19   Rutherford Guys, MD    Past Medical History:  Diagnosis Date  . Autism 07-18-11   pt. moans and makes verbal sounds,doesn't talk  . Chronic kidney disease 07-18-11   kidney stones  . Diabetes mellitus     Past Surgical History:  Procedure Laterality Date  . CYSTOSCOPY W/ URETERAL STENT PLACEMENT  07/12/2011   Procedure: CYSTOSCOPY WITH RETROGRADE PYELOGRAM/URETERAL STENT PLACEMENT;  Surgeon: Dutch Gray, MD;  Location: [facility];  Service: Urology;  Laterality: Right;  . CYSTOSCOPY W/ URETERAL STENT REMOVAL  08/06/2011   Procedure: CYSTOSCOPY WITH STENT REMOVAL;  Surgeon: Dutch Gray, MD;  Location: [facility];  Service: Urology;  Laterality: Right;  Cystscopy with Right Ureteral Stent Removal   . CYSTOSCOPY/RETROGRADE/URETEROSCOPY/STONE EXTRACTION WITH BASKET  07/19/2011   Procedure: CYSTOSCOPY/RETROGRADE/URETEROSCOPY/STONE EXTRACTION WITH BASKET;  Surgeon: Crecencio McLes Borden, MD;  Location: [facility];  Service: Urology;  Laterality: Right;  . ingrown toenails      Social History   Tobacco Use  . Smoking status: Never Smoker  . Smokeless tobacco: Never Used  Substance Use Topics  . Alcohol use: No    History reviewed. No pertinent  family history.  ROS Per hpi  Objective  Vitals as reported by the patient: none   ASSESSMENT and PLAN  1. Uncontrolled type 2 diabetes mellitus with hyperglycemia (HCC) Improved. Restart actos.   2. Autism disorder In day program. Father main caregiver.  3. Flexural eczema Refilled triamcinolone  Other orders - pioglitazone (ACTOS) 15 MG tablet; Take 1 tablet (15 mg total) by mouth daily. - triamcinolone cream (KENALOG) 0.1 %; Apply 1 application topically 2 (two) times daily.  FOLLOW-UP: 3 months   The above assessment and management plan was discussed with the patient. The patient verbalized understanding of and has agreed to the management plan. Patient is aware to call the clinic if symptoms persist or worsen. Patient is aware when to return to the clinic for a follow-up visit. Patient educated on when it is appropriate to go to the emergency department.    I provided 12 minutes of non-face-to-face time during this encounter.  Myles LippsIrma M Santiago, MD [facility] at [facility] [address] [place], [place] [address] Ph.  [phone] Fax [phone]

## 2019-02-27 NOTE — Progress Notes (Signed)
Pt is following up on dm and htn. Pt is asking about pioglita medication. Pt really did not want to do the triage over the phone just to have the same conversation with the doctor. I was able to confirm the pharmacy and medication.

## 2019-03-04 NOTE — Progress Notes (Signed)
Scheduled

## 2019-03-13 ENCOUNTER — Other Ambulatory Visit: Payer: Self-pay | Admitting: Family Medicine

## 2019-03-27 ENCOUNTER — Other Ambulatory Visit: Payer: Self-pay | Admitting: Family Medicine

## 2019-04-03 ENCOUNTER — Other Ambulatory Visit: Payer: Self-pay | Admitting: Family Medicine

## 2019-04-10 ENCOUNTER — Other Ambulatory Visit: Payer: Self-pay | Admitting: Family Medicine

## 2019-06-01 ENCOUNTER — Ambulatory Visit (INDEPENDENT_AMBULATORY_CARE_PROVIDER_SITE_OTHER): Payer: Medicare Other | Admitting: Family Medicine

## 2019-06-01 ENCOUNTER — Other Ambulatory Visit: Payer: Self-pay

## 2019-06-01 ENCOUNTER — Encounter: Payer: Self-pay | Admitting: Family Medicine

## 2019-06-01 VITALS — BP 137/69 | HR 76 | Temp 98.0°F | Ht 63.5 in | Wt 234.2 lb

## 2019-06-01 DIAGNOSIS — I10 Essential (primary) hypertension: Secondary | ICD-10-CM | POA: Diagnosis not present

## 2019-06-01 DIAGNOSIS — R21 Rash and other nonspecific skin eruption: Secondary | ICD-10-CM

## 2019-06-01 DIAGNOSIS — E1165 Type 2 diabetes mellitus with hyperglycemia: Secondary | ICD-10-CM | POA: Diagnosis not present

## 2019-06-01 LAB — POCT GLYCOSYLATED HEMOGLOBIN (HGB A1C): Hemoglobin A1C: 8.3 % — AB (ref 4.0–5.6)

## 2019-06-01 LAB — POCT SKIN KOH: Skin KOH, POC: NEGATIVE

## 2019-06-01 MED ORDER — PIOGLITAZONE HCL 15 MG PO TABS: 15.0000 mg | ORAL_TABLET | Freq: Every day | ORAL | 1 refills | Status: DC

## 2019-06-01 MED ORDER — TRIAMCINOLONE ACETONIDE 0.1 % EX CREA: 1.0000 "application " | TOPICAL_CREAM | Freq: Two times a day (BID) | CUTANEOUS | 1 refills | Status: DC

## 2019-06-01 MED ORDER — VITAMIN D 25 MCG (1000 UNIT) PO TABS: 1000.0000 [IU] | ORAL_TABLET | Freq: Every day | ORAL | Status: AC

## 2019-06-01 MED ORDER — AMLODIPINE BESYLATE 10 MG PO TABS: ORAL_TABLET | ORAL | 0 refills | Status: DC

## 2019-06-01 MED ORDER — OLMESARTAN MEDOXOMIL-HCTZ 20-12.5 MG PO TABS: 1.0000 | ORAL_TABLET | Freq: Every day | ORAL | 1 refills | Status: DC

## 2019-06-01 MED ORDER — METFORMIN HCL 1000 MG PO TABS: 1000.0000 mg | ORAL_TABLET | Freq: Two times a day (BID) | ORAL | 3 refills | Status: DC

## 2019-06-01 NOTE — Progress Notes (Signed)
11/23/202010:41 AM  Julian Reyes [birth year], 58 y.o., male [id number]  Chief Complaint  Patient presents with  . Follow-up    chronic conditions  . Rash    under right arm, usually only happens is summer. Asking  for refill of topical cream    HPI:   Patient is a 58 y.o. male with past medical history significant for autism nonverbal, HTN and DM2 who presents today for routine followup  Last OV Aug 2020 - telemedicine Restarted actos Overall doing well Has red moist rash in axilla - has been applying triamcinolone, helping somewhat He has is not taking fish oil anymore He started taking vitamin D 1000 units daily Continues to go to day program Lives with his father, brother visits every day They have been taking him every weekend to buy candy   Lab Results  Component Value Date   HGBA1C 7.2 (H) 02/23/2019   HGBA1C 8.2 (A) 05/27/2018   HGBA1C 7.4 (H) 11/26/2017   Lab Results  Component Value Date   LDLCALC 76 05/27/2018   CREATININE 1.23 02/23/2019    Depression screen PHQ 2/9 06/01/2019 02/27/2019 11/26/2018  Decreased Interest 0 0 0  Down, Depressed, Hopeless 0 0 0  PHQ - 2 Score 0 0 0    Fall Risk  06/01/2019 02/27/2019 11/26/2018 05/27/2018 01/22/2018  Falls in the past year? 0 0 0 0 No  Number falls in past yr: 0 0 - - -  Injury with Fall? 0 0 0 - -  Risk for fall due to : Mental status change - - - -     Allergies  Allergen Reactions  . Penicillins Rash    Prior to Admission medications   Medication Sig Start Date End Date Taking? Authorizing Provider  amLODipine (NORVASC) 10 MG tablet TAKE 1 TABLET BY MOUTH ONCE DAILY IN THE MORNING 12/12/18  Yes Myles LippsSantiago, Kaymarie Wynn M, MD  atorvastatin (LIPITOR) 40 MG tablet TAKE 1 TABLET BY MOUTH ONCE DAILY IN THE MORNING 04/10/19  Yes Myles LippsSantiago, Austin Pongratz M, MD  glucose blood (ACCU-CHEK AVIVA PLUS) test strip USE 1 STRIP TO CHECK GLUCOSE TWICE DAILY 05/22/18  Yes Arnette FeltsMoore, Janece, FNP  JARDIANCE 25 MG TABS tablet Take 1 tablet by  mouth once daily 03/27/19  Yes Myles LippsSantiago, Javian Nudd M, MD  Lancets (ACCU-CHEK SOFT TOUCH) lancets Use as instructed 04/02/18  Yes Myles LippsSantiago, Ellora Varnum M, MD  metFORMIN (GLUCOPHAGE) 1000 MG tablet Take 1 tablet (1,000 mg total) by mouth 2 (two) times daily with a meal. 11/26/18  Yes Myles LippsSantiago, Avalon Coppinger M, MD  olmesartan-hydrochlorothiazide (BENICAR HCT) 20-12.5 MG tablet TAKE 1 TABLET BY MOUTH DAILY 03/13/19  Yes Myles LippsSantiago, Caterine Mcmeans M, MD  Omega-3 Fatty Acids (FISH OIL) 1200 MG CAPS Take 1 capsule by mouth 2 (two) times daily.    Yes [provider]  pioglitazone (ACTOS) 15 MG tablet Take 1 tablet (15 mg total) by mouth daily. 02/27/19  Yes Myles LippsSantiago, Jasmine Mcbeth M, MD  potassium citrate (UROCIT-K) 10 MEQ (1080 MG) SR tablet Take 1 tablet by mouth twice daily 04/03/19  Yes Myles LippsSantiago, Dhruvi Crenshaw M, MD  triamcinolone cream (KENALOG) 0.1 % Apply 1 application topically 2 (two) times daily. 02/27/19  Yes Myles LippsSantiago, Shade Rivenbark M, MD    Past Medical History:  Diagnosis Date  . Autism 07-18-11   pt. moans and makes verbal sounds,doesn't talk  . Chronic kidney disease 07-18-11   kidney stones  . Diabetes mellitus     Past Surgical History:  Procedure Laterality Date  . CYSTOSCOPY W/ URETERAL  STENT PLACEMENT  07/12/2011   Procedure: CYSTOSCOPY WITH RETROGRADE PYELOGRAM/URETERAL STENT PLACEMENT;  Surgeon: Dutch Gray, MD;  [facility];  Service: Urology;  Laterality: Right;  . CYSTOSCOPY W/ URETERAL STENT REMOVAL  08/06/2011   Procedure: CYSTOSCOPY WITH STENT REMOVAL;  Surgeon: Dutch Gray, MD;  [facility];  Service: Urology;  Laterality: Right;  Cystscopy with Right Ureteral Stent Removal   . CYSTOSCOPY/RETROGRADE/URETEROSCOPY/STONE EXTRACTION WITH BASKET  07/19/2011   Procedure: CYSTOSCOPY/RETROGRADE/URETEROSCOPY/STONE EXTRACTION WITH BASKET;  Surgeon: Dutch Gray, MD;  [facility];  Service: Urology;  Laterality: Right;  . ingrown toenails      Social History   Tobacco Use  . Smoking status: Never Smoker  . Smokeless tobacco:  Never Used  Substance Use Topics  . Alcohol use: No    No family history on file.  Review of Systems  Unable to perform ROS: Patient nonverbal  per father he is doing well, has not noticed any changes in his health   OBJECTIVE:  Today's Vitals   06/01/19 1036  BP: 137/69  Pulse: 76  Temp: 98 F (36.7 C)  SpO2: 97%  Weight: 234 lb 3.2 oz (106.2 kg)  Height: 5' 3.5" (1.613 m)   Body mass index is 40.84 kg/m.  Wt Readings from Last 3 Encounters:  06/01/19 234 lb 3.2 oz (106.2 kg)  05/27/18 234 lb 9.6 oz (106.4 kg)  01/22/18 230 lb 3.2 oz (104.4 kg)    Physical Exam Vitals signs and nursing note reviewed.  Constitutional:      Appearance: He is well-developed.  HENT:     Head: Normocephalic and atraumatic.  Eyes:     Conjunctiva/sclera: Conjunctivae normal.     Pupils: Pupils are equal, round, and reactive to light.  Neck:     Musculoskeletal: Neck supple.  Cardiovascular:     Rate and Rhythm: Normal rate and regular rhythm.     Pulses:          Dorsalis pedis pulses are 2+ on the right side and 2+ on the left side.       Posterior tibial pulses are 2+ on the right side and 2+ on the left side.     Heart sounds: No murmur. No friction rub. No gallop.   Pulmonary:     Effort: Pulmonary effort is normal.     Breath sounds: Normal breath sounds. No wheezing or rales.  Musculoskeletal:     Right lower leg: No edema.     Left lower leg: No edema.     Right foot: Normal range of motion.     Left foot: Normal range of motion.  Feet:     Right foot:     Skin integrity: Skin integrity normal.     Toenail Condition: Right toenails are long. Fungal disease present.    Left foot:     Skin integrity: Skin integrity normal.     Toenail Condition: Left toenails are long. Fungal disease present. Skin:    General: Skin is warm and dry.  Neurological:     Mental Status: He is alert.      Diabetic Foot Form - Detailed   Diabetic Foot Exam - detailed Can the patient  see the bottom of their feet?: No Are the shoes appropriate in style and fit?: No Is there swelling or and abnormal foot shape?: No Is there a claw toe deformity?: No Is there elevated skin temparature?: No Is there foot or ankle muscle weakness?: No Normal Range of Motion:  No Semmes-Weinstein Monofilament Test     monofilament not done as patient unable to comprehend task/verbalize results  Results for orders placed or performed in visit on 06/01/19 (from the past 24 hour(s))  POCT Skin KOH     Status: None   Collection Time: 06/01/19 11:05 AM  Result Value Ref Range   Skin KOH, POC Negative Negative  POCT glycosylated hemoglobin (Hb A1C)     Status: Abnormal   Collection Time: 06/01/19 11:07 AM  Result Value Ref Range   Hemoglobin A1C 8.3 (A) 4.0 - 5.6 %   HbA1c POC (<> result, manual entry)     HbA1c, POC (prediabetic range)     HbA1c, POC (controlled diabetic range)      No results found.   ASSESSMENT and PLAN  1. Uncontrolled type 2 diabetes mellitus with hyperglycemia (HCC) Worse control. Discussed LFM, eliminate candy shopping. Recheck at next OV - Lipid panel - CMET with GFR - POCT glycosylated hemoglobin (Hb A1C) - Ambulatory referral to Ophthalmology  2. HTN (hypertension), benign Controlled. Continue current regime.  - Lipid panel - CMET with GFR  3. Rash and nonspecific skin eruption Nonfungal. Cont with triamcinolone as needed. - POCT Skin KOH  Other orders - triamcinolone cream (KENALOG) 0.1 %; Apply 1 application topically 2 (two) times daily. - amLODipine (NORVASC) 10 MG tablet; TAKE 1 TABLET BY MOUTH ONCE DAILY IN THE MORNING - metFORMIN (GLUCOPHAGE) 1000 MG tablet; Take 1 tablet (1,000 mg total) by mouth 2 (two) times daily with a meal. - olmesartan-hydrochlorothiazide (BENICAR HCT) 20-12.5 MG tablet; Take 1 tablet by mouth daily. - pioglitazone (ACTOS) 15 MG tablet; Take 1 tablet (15 mg total) by mouth daily. - cholecalciferol (VITAMIN D3) 25  MCG (1000 UT) tablet; Take 1 tablet (1,000 Units total) by mouth daily.  No follow-ups on file.    Myles Lipps, MD Primary Care at [facility] [address] [place], [place] [address] Ph.  [phone] Fax [phone]

## 2019-06-01 NOTE — Progress Notes (Signed)
[id number]

## 2019-06-01 NOTE — Patient Instructions (Signed)
° ° ° °  If you have lab work done today you will be contacted with your lab results within the next 2 weeks.  If you have not heard from us then please contact us. The fastest way to get your results is to register for My Chart. ° ° °IF you received an x-ray today, you will receive an invoice from [facility]. Please contact [facility] at [phone] with questions or concerns regarding your invoice.  ° °IF you received labwork today, you will receive an invoice from [facility]. Please contact [facility] at [phone] with questions or concerns regarding your invoice.  ° °Our billing staff will not be able to assist you with questions regarding bills from these companies. ° °You will be contacted with the lab results as soon as they are available. The fastest way to get your results is to activate your My Chart account. Instructions are located on the last page of this paperwork. If you have not heard from us regarding the results in 2 weeks, please contact this office. °  ° ° ° °

## 2019-06-02 ENCOUNTER — Encounter: Payer: Self-pay | Admitting: Radiology

## 2019-06-02 LAB — CMP14+EGFR
ALT: 26 IU/L (ref 0–44)
AST: 19 IU/L (ref 0–40)
Albumin/Globulin Ratio: 1.8 (ref 1.2–2.2)
Albumin: 4.9 g/dL (ref 3.8–4.9)
Alkaline Phosphatase: 130 IU/L — ABNORMAL HIGH (ref 39–117)
BUN/Creatinine Ratio: 13 (ref 10–24)
BUN: 17 mg/dL (ref 8–27)
Bilirubin Total: 0.4 mg/dL (ref 0.0–1.2)
CO2: 18 mmol/L — ABNORMAL LOW (ref 20–29)
Calcium: 10.4 mg/dL — ABNORMAL HIGH (ref 8.6–10.2)
Chloride: 100 mmol/L (ref 96–106)
Creatinine, Ser: 1.35 mg/dL — ABNORMAL HIGH (ref 0.76–1.27)
GFR calc Af Amer: 66 mL/min/{1.73_m2} (ref >59)
GFR calc non Af Amer: 57 mL/min/{1.73_m2} — ABNORMAL LOW (ref >59)
Globulin, Total: 2.8 g/dL (ref 1.5–4.5)
Glucose: 243 mg/dL — ABNORMAL HIGH (ref 65–99)
Potassium: 4.7 mmol/L (ref 3.5–5.2)
Sodium: 139 mmol/L (ref 134–144)
Total Protein: 7.7 g/dL (ref 6.0–8.5)

## 2019-06-02 LAB — LIPID PANEL
Chol/HDL Ratio: 5.2 ratio — ABNORMAL HIGH (ref 0.0–5.0)
Cholesterol, Total: 186 mg/dL (ref 100–199)
HDL: 36 mg/dL — ABNORMAL LOW (ref >39)
LDL Chol Calc (NIH): 113 mg/dL — ABNORMAL HIGH (ref 0–99)
Triglycerides: 212 mg/dL — ABNORMAL HIGH (ref 0–149)
VLDL Cholesterol Cal: 37 mg/dL (ref 5–40)

## 2019-06-05 ENCOUNTER — Ambulatory Visit: Payer: Medicare Other | Admitting: Family Medicine

## 2019-06-19 ENCOUNTER — Other Ambulatory Visit: Payer: Self-pay | Admitting: Family Medicine

## 2019-07-17 ENCOUNTER — Other Ambulatory Visit: Payer: Self-pay | Admitting: Family Medicine

## 2019-08-13 ENCOUNTER — Ambulatory Visit: Payer: Self-pay

## 2019-08-28 ENCOUNTER — Other Ambulatory Visit: Payer: Self-pay | Admitting: Family Medicine

## 2019-09-03 ENCOUNTER — Encounter: Payer: Self-pay | Admitting: Family Medicine

## 2019-09-03 ENCOUNTER — Ambulatory Visit (INDEPENDENT_AMBULATORY_CARE_PROVIDER_SITE_OTHER): Payer: Medicare Other | Admitting: Family Medicine

## 2019-09-03 ENCOUNTER — Other Ambulatory Visit: Payer: Self-pay

## 2019-09-03 VITALS — BP 138/89 | HR 105 | Temp 98.0°F | Ht 63.5 in | Wt 232.0 lb

## 2019-09-03 DIAGNOSIS — E1165 Type 2 diabetes mellitus with hyperglycemia: Secondary | ICD-10-CM

## 2019-09-03 DIAGNOSIS — E782 Mixed hyperlipidemia: Secondary | ICD-10-CM

## 2019-09-03 MED ORDER — PIOGLITAZONE HCL 30 MG PO TABS: 30.0000 mg | ORAL_TABLET | Freq: Every day | ORAL | 1 refills | Status: DC

## 2019-09-03 NOTE — Patient Instructions (Signed)
° ° ° °  If you have lab work done today you will be contacted with your lab results within the next 2 weeks.  If you have not heard from us then please contact us. The fastest way to get your results is to register for My Chart. ° ° °IF you received an x-ray today, you will receive an invoice from [facility]. Please contact [facility] at [phone] with questions or concerns regarding your invoice.  ° °IF you received labwork today, you will receive an invoice from [facility]. Please contact [facility] at [phone] with questions or concerns regarding your invoice.  ° °Our billing staff will not be able to assist you with questions regarding bills from these companies. ° °You will be contacted with the lab results as soon as they are available. The fastest way to get your results is to activate your My Chart account. Instructions are located on the last page of this paperwork. If you have not heard from us regarding the results in 2 weeks, please contact this office. °  ° ° ° °

## 2019-09-03 NOTE — Progress Notes (Signed)
2/25/202110:31 AM  Julian Reyes October 24, 1960, 59 y.o., male [id number]  Chief Complaint  Patient presents with  . Follow-up    htn, dm , hlp, no medication refills needed at this time.     HPI:   Patient is a 59 y.o. male with past medical history significant for  autism nonverbal, HTN, HLP and DM2who presents today for routine followup  Last OV Nov 2020 - work on diet Father provides all history Has been cutting back on candy Fasting cbgs, today 200, yesterday 53 Father reports that insurance nurse visited yesterday and tried to get a retinal exam but patient unable to sit still Taking all meds as prescribed No acute concerns today   Lab Results  Component Value Date   CALCIUM 10.4 (H) 06/01/2019    Lab Results  Component Value Date   HGBA1C 8.3 (A) 06/01/2019   HGBA1C 7.2 (H) 02/23/2019   HGBA1C 8.2 (A) 05/27/2018   Lab Results  Component Value Date   LDLCALC 113 (H) 06/01/2019   CREATININE 1.35 (H) 06/01/2019    Depression screen PHQ 2/9 09/03/2019 06/01/2019 02/27/2019  Decreased Interest 0 0 0  Down, Depressed, Hopeless 0 0 0  PHQ - 2 Score 0 0 0    Fall Risk  06/01/2019 02/27/2019 11/26/2018 05/27/2018 01/22/2018  Falls in the past year? 0 0 0 0 No  Number falls in past yr: 0 0 - - -  Injury with Fall? 0 0 0 - -  Risk for fall due to : Mental status change - - - -     Allergies  Allergen Reactions  . Penicillins Rash    Prior to Admission medications   Medication Sig Start Date End Date Taking? Authorizing Provider  amLODipine (NORVASC) 10 MG tablet TAKE 1 TABLET BY MOUTH ONCE DAILY IN THE MORNING 08/28/19  Yes Rutherford Guys, MD  atorvastatin (LIPITOR) 40 MG tablet TAKE 1 TABLET BY MOUTH ONCE DAILY IN THE MORNING 07/17/19  Yes Rutherford Guys, MD  cholecalciferol (VITAMIN D3) 25 MCG (1000 UT) tablet Take 1 tablet (1,000 Units total) by mouth daily. 06/01/19  Yes Rutherford Guys, MD  glucose blood (ACCU-CHEK AVIVA PLUS) test strip USE 1 STRIP TO  CHECK GLUCOSE TWICE DAILY 05/22/18  Yes Minette Brine, FNP  JARDIANCE 25 MG TABS tablet Take 1 tablet by mouth once daily 06/19/19  Yes Rutherford Guys, MD  Lancets [facility] SOFT TOUCH) lancets Use as instructed 04/02/18  Yes Rutherford Guys, MD  metFORMIN (GLUCOPHAGE) 1000 MG tablet Take 1 tablet (1,000 mg total) by mouth 2 (two) times daily with a meal. 06/01/19  Yes Rutherford Guys, MD  olmesartan-hydrochlorothiazide (BENICAR HCT) 20-12.5 MG tablet Take 1 tablet by mouth daily. 06/01/19  Yes Rutherford Guys, MD  pioglitazone (ACTOS) 15 MG tablet Take 1 tablet (15 mg total) by mouth daily. 06/01/19  Yes Rutherford Guys, MD  potassium citrate (UROCIT-K) 10 MEQ (1080 MG) SR tablet Take 1 tablet by mouth twice daily 07/17/19  Yes Rutherford Guys, MD  triamcinolone cream (KENALOG) 0.1 % Apply 1 application topically 2 (two) times daily. 06/01/19  Yes Rutherford Guys, MD    Past Medical History:  Diagnosis Date  . Autism 07-18-11   pt. moans and makes verbal sounds,doesn't talk  . Chronic kidney disease 07-18-11   kidney stones  . Diabetes mellitus     Past Surgical History:  Procedure Laterality Date  . CYSTOSCOPY W/ URETERAL STENT PLACEMENT  07/12/2011  Procedure: CYSTOSCOPY WITH RETROGRADE PYELOGRAM/URETERAL STENT PLACEMENT;  Surgeon: Dutch Gray, MD;  [facility];  Service: Urology;  Laterality: Right;  . CYSTOSCOPY W/ URETERAL STENT REMOVAL  08/06/2011   Procedure: CYSTOSCOPY WITH STENT REMOVAL;  Surgeon: Dutch Gray, MD;  [facility];  Service: Urology;  Laterality: Right;  Cystscopy with Right Ureteral Stent Removal   . CYSTOSCOPY/RETROGRADE/URETEROSCOPY/STONE EXTRACTION WITH BASKET  07/19/2011   Procedure: CYSTOSCOPY/RETROGRADE/URETEROSCOPY/STONE EXTRACTION WITH BASKET;  Surgeon: Dutch Gray, MD;  [facility];  Service: Urology;  Laterality: Right;  . ingrown toenails      Social History   Tobacco Use  . Smoking status: Never Smoker  . Smokeless tobacco: Never Used    Substance Use Topics  . Alcohol use: No    No family history on file.  Review of Systems  Constitutional: Negative for chills and fever.  Respiratory: Negative for cough and shortness of breath.   Cardiovascular: Negative for chest pain, palpitations and leg swelling.  Gastrointestinal: Negative for abdominal pain, nausea and vomiting.     OBJECTIVE:  Today's Vitals   09/03/19 1010  BP: 138/89  Pulse: (!) 105  Temp: 98 F (36.7 C)  SpO2: 97%  Weight: 232 lb (105.2 kg)  Height: 5' 3.5" (1.613 m)   Body mass index is 40.45 kg/m.  Wt Readings from Last 3 Encounters:  09/03/19 232 lb (105.2 kg)  06/01/19 234 lb 3.2 oz (106.2 kg)  05/27/18 234 lb 9.6 oz (106.4 kg)    Physical Exam Vitals and nursing note reviewed.  Constitutional:      Appearance: He is well-developed.  HENT:     Head: Normocephalic and atraumatic.  Eyes:     Conjunctiva/sclera: Conjunctivae normal.     Pupils: Pupils are equal, round, and reactive to light.  Cardiovascular:     Rate and Rhythm: Normal rate and regular rhythm.     Heart sounds: No murmur. No friction rub. No gallop.   Pulmonary:     Effort: Pulmonary effort is normal.     Breath sounds: Normal breath sounds. No wheezing or rales.  Musculoskeletal:     Cervical back: Neck supple.  Skin:    General: Skin is warm and dry.  Neurological:     Mental Status: He is alert and oriented to person, place, and time.     No results found for this or any previous visit (from the past 24 hour(s)).  No results found.   ASSESSMENT and PLAN  1. Uncontrolled type 2 diabetes mellitus with hyperglycemia (HCC) Not at goal. Increase actos 51m daily, reviewed r/se/b. a1c pending - Hemoglobin A1c - CMP14+EGFR  2. Mixed hyperlipidemia Checking labs today, medications will be adjusted as needed.  - CMP14+EGFR  3. Hypercalcemia - Calcium, ionized  Other orders - pioglitazone (ACTOS) 30 MG tablet; Take 1 tablet (30 mg total) by mouth  daily.  Return in about 3 months (around 12/01/2019).    IRutherford Guys MD [facility] [id number].  [phone] [phone]

## 2019-09-04 LAB — CMP14+EGFR
ALT: 32 IU/L (ref 0–44)
AST: 20 IU/L (ref 0–40)
Albumin/Globulin Ratio: 1.7 (ref 1.2–2.2)
Albumin: 4.5 g/dL (ref 3.8–4.9)
Alkaline Phosphatase: 128 IU/L — ABNORMAL HIGH (ref 39–117)
BUN/Creatinine Ratio: 12 (ref 10–24)
BUN: 19 mg/dL (ref 8–27)
Bilirubin Total: 0.4 mg/dL (ref 0.0–1.2)
CO2: 18 mmol/L — ABNORMAL LOW (ref 20–29)
Calcium: 9.9 mg/dL (ref 8.6–10.2)
Chloride: 97 mmol/L (ref 96–106)
Creatinine, Ser: 1.61 mg/dL — ABNORMAL HIGH (ref 0.76–1.27)
GFR calc Af Amer: 53 mL/min/{1.73_m2} — ABNORMAL LOW (ref >59)
GFR calc non Af Amer: 46 mL/min/{1.73_m2} — ABNORMAL LOW (ref >59)
Globulin, Total: 2.7 g/dL (ref 1.5–4.5)
Glucose: 260 mg/dL — ABNORMAL HIGH (ref 65–99)
Potassium: 5 mmol/L (ref 3.5–5.2)
Sodium: 138 mmol/L (ref 134–144)
Total Protein: 7.2 g/dL (ref 6.0–8.5)

## 2019-09-04 LAB — HEMOGLOBIN A1C
Est. average glucose Bld gHb Est-mCnc: 192 mg/dL
Hgb A1c MFr Bld: 8.3 % — ABNORMAL HIGH (ref 4.8–5.6)

## 2019-09-04 LAB — CALCIUM, IONIZED: Calcium, Ion: 5.1 mg/dL (ref 4.5–5.6)

## 2019-09-22 ENCOUNTER — Other Ambulatory Visit: Payer: Self-pay | Admitting: Family Medicine

## 2019-09-22 NOTE — Telephone Encounter (Signed)
Requested Prescriptions  Pending Prescriptions Disp Refills  . JARDIANCE 25 MG TABS tablet [Pharmacy Med Name: Jardiance 25 MG Oral Tablet] 90 tablet 0    Sig: Take 1 tablet by mouth once daily     Endocrinology:  Diabetes - SGLT2 Inhibitors Failed - 09/22/2019  9:52 AM      Failed - Cr in normal range and within 360 days    Creatinine, Ser  Date Value Ref Range Status  09/03/2019 1.61 (H) 0.76 - 1.27 mg/dL Final         Failed - LDL in normal range and within 360 days    LDL Chol Calc (NIH)  Date Value Ref Range Status  06/01/2019 113 (H) 0 - 99 mg/dL Final         Failed - HBA1C is between 0 and 7.9 and within 180 days    Hgb A1c MFr Bld  Date Value Ref Range Status  09/03/2019 8.3 (H) 4.8 - 5.6 % Final    Comment:             Prediabetes: 5.7 - 6.4          Diabetes: >6.4          Glycemic control for adults with diabetes: <7.0          Failed - eGFR in normal range and within 360 days    GFR calc Af Amer  Date Value Ref Range Status  09/03/2019 53 (L) >59 mL/min/1.73 Final   GFR calc non Af Amer  Date Value Ref Range Status  09/03/2019 46 (L) >59 mL/min/1.73 Final         Passed - Valid encounter within last 6 months    Recent Outpatient Visits          2 weeks ago Uncontrolled type 2 diabetes mellitus with hyperglycemia ([place])   [facility], Lilia Argue, MD   3 months ago Uncontrolled type 2 diabetes mellitus with hyperglycemia [facility])   [facility], Lilia Argue, MD   6 months ago Uncontrolled type 2 diabetes mellitus with hyperglycemia [facility])   [facility], Lilia Argue, MD   7 months ago Uncontrolled type 2 diabetes mellitus with hyperglycemia [facility])   Primary Care at [facility], Zoe A, MD   10 months ago Uncontrolled type 2 diabetes mellitus with hyperglycemia [facility])   [facility], Lilia Argue, MD      Future Appointments            In 2 months Rutherford Guys, MD Primary Care at [place], [facility]

## 2019-10-09 ENCOUNTER — Other Ambulatory Visit: Payer: Self-pay | Admitting: Family Medicine

## 2019-11-05 NOTE — Telephone Encounter (Signed)
No action needed

## 2019-11-27 ENCOUNTER — Other Ambulatory Visit: Payer: Self-pay | Admitting: Family Medicine

## 2019-11-27 NOTE — Telephone Encounter (Signed)
Requested Prescriptions  Pending Prescriptions Disp Refills  . amLODipine (NORVASC) 10 MG tablet [Pharmacy Med Name: amLODIPine Besylate 10 MG Oral Tablet] 90 tablet 0    Sig: TAKE 1 TABLET BY MOUTH ONCE DAILY IN THE MORNING     Cardiovascular:  Calcium Channel Blockers Passed - 11/27/2019 10:40 AM      Passed - Last BP in normal range    BP Readings from Last 1 Encounters:  09/03/19 138/89         Passed - Valid encounter within last 6 months    Recent Outpatient Visits          2 months ago Uncontrolled type 2 diabetes mellitus with hyperglycemia (HCC)   [facility], Meda Coffee, MD   5 months ago Uncontrolled type 2 diabetes mellitus with hyperglycemia [facility])   [facility], Meda Coffee, MD   9 months ago Uncontrolled type 2 diabetes mellitus with hyperglycemia [facility])   [facility], Meda Coffee, MD   9 months ago Uncontrolled type 2 diabetes mellitus with hyperglycemia [facility])   Primary Care at [facility], Zoe A, MD   1 year ago Uncontrolled type 2 diabetes mellitus with hyperglycemia [facility])   [facility], Meda Coffee, MD      Future Appointments            In 4 days Myles Lipps, MD Primary Care at [place], [facility]

## 2019-12-01 ENCOUNTER — Encounter: Payer: Self-pay | Admitting: Family Medicine

## 2019-12-01 ENCOUNTER — Other Ambulatory Visit: Payer: Self-pay

## 2019-12-01 ENCOUNTER — Ambulatory Visit (INDEPENDENT_AMBULATORY_CARE_PROVIDER_SITE_OTHER): Payer: Medicare Other | Admitting: Family Medicine

## 2019-12-01 VITALS — BP 129/72 | HR 102 | Temp 98.0°F | Ht 63.5 in | Wt 235.0 lb

## 2019-12-01 DIAGNOSIS — E119 Type 2 diabetes mellitus without complications: Secondary | ICD-10-CM | POA: Diagnosis not present

## 2019-12-01 DIAGNOSIS — I10 Essential (primary) hypertension: Secondary | ICD-10-CM

## 2019-12-01 DIAGNOSIS — E785 Hyperlipidemia, unspecified: Secondary | ICD-10-CM

## 2019-12-01 DIAGNOSIS — E1169 Type 2 diabetes mellitus with other specified complication: Secondary | ICD-10-CM | POA: Diagnosis not present

## 2019-12-01 DIAGNOSIS — F840 Autistic disorder: Secondary | ICD-10-CM

## 2019-12-01 MED ORDER — POTASSIUM CITRATE ER 10 MEQ (1080 MG) PO TBCR: 10.0000 meq | EXTENDED_RELEASE_TABLET | Freq: Two times a day (BID) | ORAL | 1 refills | Status: DC

## 2019-12-01 MED ORDER — ATORVASTATIN CALCIUM 40 MG PO TABS: ORAL_TABLET | ORAL | 3 refills | Status: DC

## 2019-12-01 MED ORDER — PIOGLITAZONE HCL 30 MG PO TABS: 30.0000 mg | ORAL_TABLET | Freq: Every day | ORAL | 1 refills | Status: DC

## 2019-12-01 MED ORDER — AMLODIPINE BESYLATE 10 MG PO TABS: 10.0000 mg | ORAL_TABLET | Freq: Every day | ORAL | 3 refills | Status: DC

## 2019-12-01 MED ORDER — EMPAGLIFLOZIN 25 MG PO TABS: 25.0000 mg | ORAL_TABLET | Freq: Every day | ORAL | 1 refills | Status: DC

## 2019-12-01 MED ORDER — METFORMIN HCL 1000 MG PO TABS: 1000.0000 mg | ORAL_TABLET | Freq: Two times a day (BID) | ORAL | 3 refills | Status: DC

## 2019-12-01 NOTE — Progress Notes (Signed)
5/25/202110:52 AM  Julian Reyes 08/08/1960, 59 y.o., male [id number]  Chief Complaint  Patient presents with  . Diabetes    fbs - 130's    HPI:   Patient is a 59 y.o. male with past medical history significant for autismnonverbal, HTN, HLP and DM2who presents today for routine followup  Last OV feb 2021 - increased actos  Here with father who is caregiver He has been tolerating actos well Morning cbgs 130s Denies any lows Continues to struggle with diet as prefers chips and sweets Does not check BP at home Has completed covid vaccines  Wt Readings from Last 3 Encounters:  12/01/19 235 lb (106.6 kg)  09/03/19 232 lb (105.2 kg)  06/01/19 234 lb 3.2 oz (106.2 kg)   BP Readings from Last 3 Encounters:  12/01/19 (!) 147/83  09/03/19 138/89  06/01/19 137/69    Lab Results  Component Value Date   HGBA1C 8.3 (H) 09/03/2019   HGBA1C 8.3 (A) 06/01/2019   HGBA1C 7.2 (H) 02/23/2019   Lab Results  Component Value Date   LDLCALC 113 (H) 06/01/2019   CREATININE 1.61 (H) 09/03/2019     Depression screen PHQ 2/9 12/01/2019 09/03/2019 06/01/2019  Decreased Interest 0 0 0  Down, Depressed, Hopeless 0 0 0  PHQ - 2 Score 0 0 0    Fall Risk  12/01/2019 06/01/2019 02/27/2019 11/26/2018 05/27/2018  Falls in the past year? 0 0 0 0 0  Number falls in past yr: 0 0 0 - -  Injury with Fall? 0 0 0 0 -  Risk for fall due to : - Mental status change - - -  Follow up Falls evaluation completed - - - -     Allergies  Allergen Reactions  . Penicillins Rash    Prior to Admission medications   Medication Sig Start Date End Date Taking? Authorizing Provider  amLODipine (NORVASC) 10 MG tablet TAKE 1 TABLET BY MOUTH ONCE DAILY IN THE MORNING 11/27/19  Yes Rutherford Guys, MD  atorvastatin (LIPITOR) 40 MG tablet TAKE 1 TABLET BY MOUTH ONCE DAILY IN THE MORNING 07/17/19  Yes Rutherford Guys, MD  cholecalciferol (VITAMIN D3) 25 MCG (1000 UT) tablet Take 1 tablet (1,000 Units total) by  mouth daily. 06/01/19  Yes Rutherford Guys, MD  glucose blood (ACCU-CHEK AVIVA PLUS) test strip USE 1 STRIP TO CHECK GLUCOSE TWICE DAILY 05/22/18  Yes Minette Brine, FNP  JARDIANCE 25 MG TABS tablet Take 1 tablet by mouth once daily 09/22/19  Yes Rutherford Guys, MD  Lancets [facility] SOFT TOUCH) lancets Use as instructed 04/02/18  Yes Rutherford Guys, MD  metFORMIN (GLUCOPHAGE) 1000 MG tablet Take 1 tablet (1,000 mg total) by mouth 2 (two) times daily with a meal. 06/01/19  Yes Rutherford Guys, MD  olmesartan-hydrochlorothiazide (BENICAR HCT) 20-12.5 MG tablet Take 1 tablet by mouth daily. 06/01/19  Yes Rutherford Guys, MD  pioglitazone (ACTOS) 30 MG tablet Take 1 tablet (30 mg total) by mouth daily. 09/03/19  Yes Rutherford Guys, MD  potassium citrate (UROCIT-K) 10 MEQ (1080 MG) SR tablet Take 1 tablet by mouth twice daily 10/09/19  Yes Rutherford Guys, MD  triamcinolone cream (KENALOG) 0.1 % Apply 1 application topically 2 (two) times daily. 06/01/19  Yes Rutherford Guys, MD    Past Medical History:  Diagnosis Date  . Autism 07-18-11   pt. moans and makes verbal sounds,doesn't talk  . Chronic kidney disease 07-18-11   kidney stones  .  Diabetes mellitus     Past Surgical History:  Procedure Laterality Date  . CYSTOSCOPY W/ URETERAL STENT PLACEMENT  07/12/2011   Procedure: CYSTOSCOPY WITH RETROGRADE PYELOGRAM/URETERAL STENT PLACEMENT;  Surgeon: Crecencio Mc, MD;  [facility];  Service: Urology;  Laterality: Right;  . CYSTOSCOPY W/ URETERAL STENT REMOVAL  08/06/2011   Procedure: CYSTOSCOPY WITH STENT REMOVAL;  Surgeon: Crecencio Mc, MD;  [facility];  Service: Urology;  Laterality: Right;  Cystscopy with Right Ureteral Stent Removal   . CYSTOSCOPY/RETROGRADE/URETEROSCOPY/STONE EXTRACTION WITH BASKET  07/19/2011   Procedure: CYSTOSCOPY/RETROGRADE/URETEROSCOPY/STONE EXTRACTION WITH BASKET;  Surgeon: Crecencio Mc, MD;  [facility];  Service: Urology;  Laterality: Right;  . ingrown  toenails      Social History   Tobacco Use  . Smoking status: Never Smoker  . Smokeless tobacco: Never Used  Substance Use Topics  . Alcohol use: No    No family history on file.  Review of Systems  Constitutional: Negative for chills and fever.  Respiratory: Negative for cough and shortness of breath.   Cardiovascular: Negative for chest pain, palpitations and leg swelling.  Gastrointestinal: Negative for abdominal pain, nausea and vomiting.     OBJECTIVE:  Today's Vitals   12/01/19 1041 12/01/19 1105  BP: (!) 147/83 129/72  Pulse: (!) 102   Temp: 98 F (36.7 C)   SpO2: 97%   Weight: 235 lb (106.6 kg)   Height: 5' 3.5" (1.613 m)    Body mass index is 40.98 kg/m.   Physical Exam Vitals and nursing note reviewed.  Constitutional:      Appearance: He is well-developed.  HENT:     Head: Normocephalic and atraumatic.  Eyes:     Conjunctiva/sclera: Conjunctivae normal.     Pupils: Pupils are equal, round, and reactive to light.  Cardiovascular:     Rate and Rhythm: Normal rate and regular rhythm.     Heart sounds: No murmur. No friction rub. No gallop.   Pulmonary:     Effort: Pulmonary effort is normal.     Breath sounds: Normal breath sounds. No wheezing or rales.  Musculoskeletal:     Cervical back: Neck supple.  Skin:    General: Skin is warm and dry.  Neurological:     Mental Status: He is alert. Mental status is at baseline.     No results found for this or any previous visit (from the past 24 hour(s)).  No results found.   ASSESSMENT and PLAN  1. Type 2 diabetes mellitus without complication, without long-term current use of insulin (HCC) Checking labs today, medications will be adjusted as needed. Discussed again limiting junk food - Hemoglobin A1c - Microalbumin/Creatinine Ratio, Urine  2. HTN (hypertension), benign Controlled. Continue current regime.  - Comprehensive metabolic panel  3. Hyperlipidemia associated with type 2 diabetes  mellitus (HCC) Checking labs today, medications will be adjusted as needed.  - Lipid panel  4. Autism disorder In day program, father is main caregiver, no behavior concerns  Other orders - amLODipine (NORVASC) 10 MG tablet; Take 1 tablet (10 mg total) by mouth daily. - atorvastatin (LIPITOR) 40 MG tablet; TAKE 1 TABLET BY MOUTH ONCE DAILY IN THE MORNING - empagliflozin (JARDIANCE) 25 MG TABS tablet; Take 1 tablet (25 mg total) by mouth daily. - metFORMIN (GLUCOPHAGE) 1000 MG tablet; Take 1 tablet (1,000 mg total) by mouth 2 (two) times daily with a meal. - pioglitazone (ACTOS) 30 MG tablet; Take 1 tablet (30 mg total) by mouth daily. -  potassium citrate (UROCIT-K) 10 MEQ (1080 MG) SR tablet; Take 1 tablet (10 mEq total) by mouth 2 (two) times daily.  Return in about 6 months (around 06/02/2020).    Myles Lipps, MD Primary Care at [facility] [address] [place], [place] [address] Ph.  [phone] Fax [phone]

## 2019-12-01 NOTE — Patient Instructions (Signed)
° ° ° °  If you have lab work done today you will be contacted with your lab results within the next 2 weeks.  If you have not heard from us then please contact us. The fastest way to get your results is to register for My Chart. ° ° °IF you received an x-ray today, you will receive an invoice from [facility]. Please contact [facility] at [phone] with questions or concerns regarding your invoice.  ° °IF you received labwork today, you will receive an invoice from [facility]. Please contact [facility] at [phone] with questions or concerns regarding your invoice.  ° °Our billing staff will not be able to assist you with questions regarding bills from these companies. ° °You will be contacted with the lab results as soon as they are available. The fastest way to get your results is to activate your My Chart account. Instructions are located on the last page of this paperwork. If you have not heard from us regarding the results in 2 weeks, please contact this office. °  ° ° ° °

## 2019-12-02 LAB — MICROALBUMIN / CREATININE URINE RATIO
Creatinine, Urine: 50.2 mg/dL
Microalb/Creat Ratio: <6 mg/g creat (ref 0–29)
Microalbumin, Urine: <3 ug/mL

## 2019-12-02 LAB — COMPREHENSIVE METABOLIC PANEL
ALT: 21 IU/L (ref 0–44)
AST: 21 IU/L (ref 0–40)
Albumin/Globulin Ratio: 1.7 (ref 1.2–2.2)
Albumin: 4.7 g/dL (ref 3.8–4.9)
Alkaline Phosphatase: 116 IU/L (ref 48–121)
BUN/Creatinine Ratio: 9 — ABNORMAL LOW (ref 10–24)
BUN: 12 mg/dL (ref 8–27)
Bilirubin Total: 0.4 mg/dL (ref 0.0–1.2)
CO2: 19 mmol/L — ABNORMAL LOW (ref 20–29)
Calcium: 10.1 mg/dL (ref 8.6–10.2)
Chloride: 102 mmol/L (ref 96–106)
Creatinine, Ser: 1.4 mg/dL — ABNORMAL HIGH (ref 0.76–1.27)
GFR calc Af Amer: 63 mL/min/{1.73_m2} (ref >59)
GFR calc non Af Amer: 54 mL/min/{1.73_m2} — ABNORMAL LOW (ref >59)
Globulin, Total: 2.7 g/dL (ref 1.5–4.5)
Glucose: 237 mg/dL — ABNORMAL HIGH (ref 65–99)
Potassium: 4.6 mmol/L (ref 3.5–5.2)
Sodium: 140 mmol/L (ref 134–144)
Total Protein: 7.4 g/dL (ref 6.0–8.5)

## 2019-12-02 LAB — LIPID PANEL
Chol/HDL Ratio: 4.2 ratio (ref 0.0–5.0)
Cholesterol, Total: 161 mg/dL (ref 100–199)
HDL: 38 mg/dL — ABNORMAL LOW (ref >39)
LDL Chol Calc (NIH): 94 mg/dL (ref 0–99)
Triglycerides: 165 mg/dL — ABNORMAL HIGH (ref 0–149)
VLDL Cholesterol Cal: 29 mg/dL (ref 5–40)

## 2019-12-02 LAB — HEMOGLOBIN A1C
Est. average glucose Bld gHb Est-mCnc: 171 mg/dL
Hgb A1c MFr Bld: 7.6 % — ABNORMAL HIGH (ref 4.8–5.6)

## 2019-12-05 ENCOUNTER — Other Ambulatory Visit: Payer: Self-pay | Admitting: Family Medicine

## 2019-12-05 NOTE — Telephone Encounter (Signed)
Requested Prescriptions  Pending Prescriptions Disp Refills  . olmesartan-hydrochlorothiazide (BENICAR HCT) 20-12.5 MG tablet [Pharmacy Med Name: Olmesartan Medoxomil-HCTZ 20-12.5 MG Oral Tablet] 90 tablet 1    Sig: Take 1 tablet by mouth once daily     Cardiovascular: ARB + Diuretic Combos Failed - 12/05/2019 12:25 PM      Failed - Cr in normal range and within 180 days    Creatinine, Ser  Date Value Ref Range Status  12/01/2019 1.40 (H) 0.76 - 1.27 mg/dL Final         Passed - K in normal range and within 180 days    Potassium  Date Value Ref Range Status  12/01/2019 4.6 3.5 - 5.2 mmol/L Final         Passed - Na in normal range and within 180 days    Sodium  Date Value Ref Range Status  12/01/2019 140 134 - 144 mmol/L Final         Passed - Ca in normal range and within 180 days    Calcium  Date Value Ref Range Status  12/01/2019 10.1 8.6 - 10.2 mg/dL Final   Calcium, Ion  Date Value Ref Range Status  09/03/2019 5.1 4.5 - 5.6 mg/dL Final         Passed - Patient is not pregnant      Passed - Last BP in normal range    BP Readings from Last 1 Encounters:  12/01/19 129/72         Passed - Valid encounter within last 6 months    Recent Outpatient Visits          4 days ago Type 2 diabetes mellitus without complication, without long-term current use of insulin (HCC)   [facility], Meda Coffee, MD   3 months ago Uncontrolled type 2 diabetes mellitus with hyperglycemia [facility])   [facility], Meda Coffee, MD   6 months ago Uncontrolled type 2 diabetes mellitus with hyperglycemia [facility])   [facility] at [facility], Meda Coffee, MD   9 months ago Uncontrolled type 2 diabetes mellitus with hyperglycemia [facility])   [facility] at [facility], Meda Coffee, MD   9 months ago Uncontrolled type 2 diabetes mellitus with hyperglycemia [facility])   [facility] at [facility], Manus Rudd, MD      Future Appointments            In 5 months Myles Lipps, MD Primary Care at [place], [facility]

## 2019-12-11 ENCOUNTER — Encounter: Payer: Self-pay | Admitting: Radiology

## 2020-05-31 ENCOUNTER — Ambulatory Visit: Payer: Medicare Other | Admitting: Family Medicine

## 2020-06-08 ENCOUNTER — Telehealth: Payer: Self-pay | Admitting: Family Medicine

## 2020-06-08 MED ORDER — AMLODIPINE BESYLATE 10 MG PO TABS: 10.0000 mg | ORAL_TABLET | Freq: Every day | ORAL | 1 refills | Status: DC

## 2020-06-08 MED ORDER — PIOGLITAZONE HCL 30 MG PO TABS: 30.0000 mg | ORAL_TABLET | Freq: Every day | ORAL | 1 refills | Status: DC

## 2020-06-08 MED ORDER — OLMESARTAN MEDOXOMIL-HCTZ 20-12.5 MG PO TABS: 1.0000 | ORAL_TABLET | Freq: Every day | ORAL | 1 refills | Status: DC

## 2020-06-08 NOTE — Telephone Encounter (Signed)
Refilled for 90 days supply until he is seen by new provider in March.

## 2020-06-08 NOTE — Telephone Encounter (Signed)
[employer] is calling on behalf of pt pt is requesting courtesy refill on  What is the name of the medication? olmesartan-hydrochlorothiazide (BENICAR HCT) 20-12.5 MG tablet [[id number]]   pioglitazone (ACTOS) 30 MG tablet [[id number] amLODipine (NORVASC) 10 MG tablet [[id number]     Have you contacted your pharmacy to request a refill? y  Which pharmacy would you like this sent to? [facility] [address] ([address]), [place] - [address]  [address] [place]) [place] [address]  Phone:  [phone] Fax:  [phone]  DEA #:  --   Patient notified that their request is being sent to the clinical staff for review and that they should receive a call once it is complete. If they do not receive a call within 72 hours they can check with their pharmacy or our office.

## 2020-06-29 ENCOUNTER — Other Ambulatory Visit: Payer: Self-pay | Admitting: Emergency Medicine

## 2020-06-29 MED ORDER — EMPAGLIFLOZIN 25 MG PO TABS: 25.0000 mg | ORAL_TABLET | Freq: Every day | ORAL | 1 refills | Status: DC

## 2020-06-29 NOTE — Telephone Encounter (Signed)
Copied from CRM [id number]. Topic: Quick Communication - Rx Refill/Question >> Jun 29, 2020 10:50 AM Jaquita Rector A wrote: Medication: empagliflozin (JARDIANCE) 25 MG TABS tablet  Has the patient contacted their pharmacy? Yes.   (Agent: If no, request that the patient contact the pharmacy for the refill.) (Agent: If yes, when and what did the pharmacy advise?)  Preferred Pharmacy (with phone number or street name): [facility] [address] [place]), [place] - [id number]  Phone:  [phone] Fax:  [phone]     Agent: Please be advised that RX refills may take up to 3 business days. We ask that you follow-up with your pharmacy.

## 2020-07-14 ENCOUNTER — Other Ambulatory Visit: Payer: Self-pay | Admitting: Emergency Medicine

## 2020-07-14 MED ORDER — POTASSIUM CITRATE ER 10 MEQ (1080 MG) PO TBCR: 10.0000 meq | EXTENDED_RELEASE_TABLET | Freq: Two times a day (BID) | ORAL | 0 refills | Status: DC

## 2020-07-14 MED ORDER — METFORMIN HCL 1000 MG PO TABS: 1000.0000 mg | ORAL_TABLET | Freq: Two times a day (BID) | ORAL | 0 refills | Status: DC

## 2020-07-14 NOTE — Telephone Encounter (Signed)
Copied from CRM [id number]. Topic: Quick Communication - Rx Refill/Question >> Jul 14, 2020 10:45 AM Marylen Ponto wrote: Medication: potassium citrate (UROCIT-K) 10 MEQ (1080 MG) SR tablet and metFORMIN (GLUCOPHAGE) 1000 MG tablet  Has the patient contacted their pharmacy? yes   Preferred Pharmacy (with phone number or street name): [facility] [address] [place]), [place] - [id number]  Phone: [phone]   Fax: [phone]  Agent: Please be advised that RX refills may take up to 3 business days. We ask that you follow-up with your pharmacy.

## 2020-08-22 ENCOUNTER — Encounter: Payer: Medicare Other | Admitting: Family

## 2020-08-22 NOTE — Progress Notes (Signed)
Patient did not show for appointment. Prefers to receive care at [facility].

## 2020-08-22 NOTE — Progress Notes (Signed)
Spoke w/Arran J. Bellantoni(father) which stated not sure why appt was scheduled pt prev Dr.no longer works for [employer], but he is scheduled to see different provider at [place] on 3/3 so not transferring care

## 2020-08-31 ENCOUNTER — Telehealth: Payer: Medicare Other | Admitting: Internal Medicine

## 2020-09-08 ENCOUNTER — Encounter: Payer: Medicare Other | Admitting: Emergency Medicine

## 2020-09-08 ENCOUNTER — Ambulatory Visit (INDEPENDENT_AMBULATORY_CARE_PROVIDER_SITE_OTHER): Payer: Medicare Other | Admitting: Emergency Medicine

## 2020-09-08 ENCOUNTER — Other Ambulatory Visit: Payer: Self-pay

## 2020-09-08 ENCOUNTER — Encounter: Payer: Self-pay | Admitting: Emergency Medicine

## 2020-09-08 VITALS — BP 135/83 | HR 94 | Temp 98.6°F | Resp 16 | Ht 63.5 in | Wt 239.0 lb

## 2020-09-08 DIAGNOSIS — K219 Gastro-esophageal reflux disease without esophagitis: Secondary | ICD-10-CM

## 2020-09-08 DIAGNOSIS — E785 Hyperlipidemia, unspecified: Secondary | ICD-10-CM | POA: Diagnosis not present

## 2020-09-08 DIAGNOSIS — E1165 Type 2 diabetes mellitus with hyperglycemia: Secondary | ICD-10-CM | POA: Diagnosis not present

## 2020-09-08 DIAGNOSIS — E1169 Type 2 diabetes mellitus with other specified complication: Secondary | ICD-10-CM | POA: Diagnosis not present

## 2020-09-08 DIAGNOSIS — F840 Autistic disorder: Secondary | ICD-10-CM | POA: Diagnosis not present

## 2020-09-08 DIAGNOSIS — Z7689 Persons encountering health services in other specified circumstances: Secondary | ICD-10-CM

## 2020-09-08 LAB — POCT GLYCOSYLATED HEMOGLOBIN (HGB A1C): Hemoglobin A1C: 8.8 % — AB (ref 4.0–5.6)

## 2020-09-08 LAB — GLUCOSE, POCT (MANUAL RESULT ENTRY): POC Glucose: 156 mg/dl — AB (ref 70–99)

## 2020-09-08 MED ORDER — GLIPIZIDE 5 MG PO TABS
5.0000 mg | ORAL_TABLET | Freq: Two times a day (BID) | ORAL | 3 refills | Status: DC
Start: 2020-09-08 — End: 2021-06-06

## 2020-09-08 NOTE — Assessment & Plan Note (Signed)
Uncontrolled diabetes with hemoglobin A1c at 8.8.  Continue Metformin and Jardiance and Actos.  Start glipizide 5 mg twice a day. Follow-up in 3 months.

## 2020-09-08 NOTE — Progress Notes (Signed)
Julian Reyes 60 y.o.   Chief Complaint  Patient presents with  . Establish Care    Pt here to establish care no concerns     HISTORY OF PRESENT ILLNESS: This is a 60 y.o. male former patient of Dr. Pamella Pert here to establish care with me. Accompanied by brother. Patient has history of autism. No complaints or medical concerns today. Has history of diabetes and dyslipidemia.  HPI   Prior to Admission medications   Medication Sig Start Date End Date Taking? Authorizing Provider  amLODipine (NORVASC) 10 MG tablet Take 1 tablet (10 mg total) by mouth daily. 06/08/20  Yes Ayona Yniguez, Ines Bloomer, MD  atorvastatin (LIPITOR) 40 MG tablet TAKE 1 TABLET BY MOUTH ONCE DAILY IN THE MORNING 12/01/19  Yes Jacelyn Pi, Lilia Argue, MD  cholecalciferol (VITAMIN D3) 25 MCG (1000 UT) tablet Take 1 tablet (1,000 Units total) by mouth daily. 06/01/19  Yes Jacelyn Pi, Lilia Argue, MD  empagliflozin (JARDIANCE) 25 MG TABS tablet Take 1 tablet (25 mg total) by mouth daily. 06/29/20  Yes Athene Schuhmacher, Ines Bloomer, MD  glucose blood (ACCU-CHEK AVIVA PLUS) test strip USE 1 STRIP TO CHECK GLUCOSE TWICE DAILY 05/22/18  Yes Minette Brine, FNP  Lancets (ACCU-CHEK SOFT TOUCH) lancets Use as instructed 04/02/18  Yes Jacelyn Pi, Lilia Argue, MD  metFORMIN (GLUCOPHAGE) 1000 MG tablet Take 1 tablet (1,000 mg total) by mouth 2 (two) times daily with a meal. 07/14/20  Yes Roddie Riegler, Ines Bloomer, MD  olmesartan-hydrochlorothiazide (BENICAR HCT) 20-12.5 MG tablet Take 1 tablet by mouth daily. 06/08/20  Yes Dewitte Vannice, Ines Bloomer, MD  pioglitazone (ACTOS) 30 MG tablet Take 1 tablet (30 mg total) by mouth daily. 06/08/20  Yes Carollyn Etcheverry, Ines Bloomer, MD  potassium citrate (UROCIT-K) 10 MEQ (1080 MG) SR tablet Take 1 tablet (10 mEq total) by mouth 2 (two) times daily. 07/14/20  Yes Messiyah Waterson, Ines Bloomer, MD  triamcinolone cream (KENALOG) 0.1 % Apply 1 application topically 2 (two) times daily. 06/01/19  Yes Jacelyn Pi, Lilia Argue, MD    Allergies   Allergen Reactions  . Penicillins Rash    Patient Active Problem List   Diagnosis Date Noted  . Dental disease 06/28/2016  . Autism disorder 04/05/2014  . Gastroesophageal reflux disease without esophagitis 04/05/2014  . HTN (hypertension), benign 04/05/2014  . Hyperlipidemia 04/05/2014  . Type 2 diabetes mellitus without complication ([place]) 08/65/7846    Past Medical History:  Diagnosis Date  . Autism 07-18-11   pt. moans and makes verbal sounds,doesn't talk  . Chronic kidney disease 07-18-11   kidney stones  . Diabetes mellitus     Past Surgical History:  Procedure Laterality Date  . CYSTOSCOPY W/ URETERAL STENT PLACEMENT  07/12/2011   Procedure: CYSTOSCOPY WITH RETROGRADE PYELOGRAM/URETERAL STENT PLACEMENT;  Surgeon: Dutch Gray, MD;  Location: [facility];  Service: Urology;  Laterality: Right;  . CYSTOSCOPY W/ URETERAL STENT REMOVAL  08/06/2011   Procedure: CYSTOSCOPY WITH STENT REMOVAL;  Surgeon: Dutch Gray, MD;  Location: [facility];  Service: Urology;  Laterality: Right;  Cystscopy with Right Ureteral Stent Removal   . CYSTOSCOPY/RETROGRADE/URETEROSCOPY/STONE EXTRACTION WITH BASKET  07/19/2011   Procedure: CYSTOSCOPY/RETROGRADE/URETEROSCOPY/STONE EXTRACTION WITH BASKET;  Surgeon: Dutch Gray, MD;  Location: [facility];  Service: Urology;  Laterality: Right;  . ingrown toenails      Social History   Socioeconomic History  . Marital status: Single    Spouse name: Not on file  . Number of children: Not on file  . Years of education: Not on  file  . Highest education level: Not on file  Occupational History  . Not on file  Tobacco Use  . Smoking status: Never Smoker  . Smokeless tobacco: Never Used  Substance and Sexual Activity  . Alcohol use: No  . Drug use: No  . Sexual activity: Never  Other Topics Concern  . Not on file  Social History Narrative  . Not on file   Social Determinants of Health   Financial Resource Strain: Not on file  Food Insecurity: Not on file   Transportation Needs: Not on file  Physical Activity: Not on file  Stress: Not on file  Social Connections: Not on file  Intimate Partner Violence: Not on file    No family history on file.   Review of Systems  Constitutional: Negative.  Negative for chills and fever.  HENT: Negative.  Negative for sore throat.   Respiratory: Negative.  Negative for cough and shortness of breath.   Cardiovascular: Negative.  Negative for chest pain.  Gastrointestinal: Negative for abdominal pain, nausea and vomiting.  Genitourinary: Negative.  Negative for dysuria and hematuria.  Skin: Negative.  Negative for rash.  Neurological: Negative.  Negative for dizziness and headaches.   Today's Vitals   09/08/20 1420  BP: 135/83  Pulse: 94  Resp: 16  Temp: 98.6 F (37 C)  TempSrc: Temporal  SpO2: 96%  Weight: 239 lb (108.4 kg)  Height: 5' 3.5" (1.613 m)   Body mass index is 41.67 kg/m. Wt Readings from Last 3 Encounters:  09/08/20 239 lb (108.4 kg)  12/01/19 235 lb (106.6 kg)  09/03/19 232 lb (105.2 kg)     Physical Exam Vitals reviewed.  Constitutional:      Appearance: Normal appearance. He is obese.  HENT:     Head: Normocephalic.  Eyes:     Extraocular Movements: Extraocular movements intact.     Pupils: Pupils are equal, round, and reactive to light.  Cardiovascular:     Rate and Rhythm: Normal rate and regular rhythm.     Pulses: Normal pulses.     Heart sounds: Normal heart sounds.  Pulmonary:     Effort: Pulmonary effort is normal.     Breath sounds: Normal breath sounds.  Abdominal:     Palpations: Abdomen is soft.     Tenderness: There is no abdominal tenderness.  Musculoskeletal:     Cervical back: Normal range of motion and neck supple.     Right lower leg: No edema.     Left lower leg: No edema.  Skin:    General: Skin is warm and dry.     Capillary Refill: Capillary refill takes less than 2 seconds.  Neurological:     General: No focal deficit present.      Mental Status: He is alert.     Results for orders placed or performed in visit on 09/08/20 (from the past 24 hour(s))  POCT glucose (manual entry)     Status: Abnormal   Collection Time: 09/08/20  2:49 PM  Result Value Ref Range   POC Glucose 156 (A) 70 - 99 mg/dl  POCT glycosylated hemoglobin (Hb A1C)     Status: Abnormal   Collection Time: 09/08/20  2:54 PM  Result Value Ref Range   Hemoglobin A1C 8.8 (A) 4.0 - 5.6 %   HbA1c POC (<> result, manual entry)     HbA1c, POC (prediabetic range)     HbA1c, POC (controlled diabetic range)      ASSESSMENT &  PLAN: Hyperlipidemia associated with type 2 diabetes mellitus ([place]) Uncontrolled diabetes with hemoglobin A1c at 8.8.  Continue Metformin and Jardiance and Actos.  Start glipizide 5 mg twice a day. Follow-up in 3 months.  Braheem was seen today for establish care.  Diagnoses and all orders for this visit:  Hyperlipidemia associated with type 2 diabetes mellitus ([place]) -     CMP14+EGFR -     Lipid panel -     POCT glucose (manual entry) -     POCT glycosylated hemoglobin (Hb A1C)  Autism disorder  Gastroesophageal reflux disease without esophagitis  Uncontrolled type 2 diabetes mellitus with hyperglycemia ([facility]) -     glipiZIDE (GLUCOTROL) 5 MG tablet; Take 1 tablet (5 mg total) by mouth 2 (two) times daily before a meal.  Encounter to establish care    Patient Instructions       If you have lab work done today you will be contacted with your lab results within the next 2 weeks.  If you have not heard from [place] then please contact us. The fastest way to get your results is to register for My Chart.   IF you received an x-ray today, you will receive an invoice from [facility]. Please contact [facility] at [phone] with questions or concerns regarding your invoice.   IF you received labwork today, you will receive an invoice from [facility]. Please contact [facility] at [phone] with questions or  concerns regarding your invoice.   Our billing staff will not be able to assist you with questions regarding bills from these companies.  You will be contacted with the lab results as soon as they are available. The fastest way to get your results is to activate your My Chart account. Instructions are located on the last page of this paperwork. If you have not heard from [place] regarding the results in 2 weeks, please contact this office.     Diabetes Mellitus and Nutrition, Adult When you have diabetes, or diabetes mellitus, it is very important to have healthy eating habits because your blood sugar (glucose) levels are greatly affected by what you eat and drink. Eating healthy foods in the right amounts, at about the same times every day, can help you:  Control your blood glucose.  Lower your risk of heart disease.  Improve your blood pressure.  Reach or maintain a healthy weight. What can affect my meal plan? Every person with diabetes is different, and each person has different needs for a meal plan. Your health care provider may recommend that you work with a dietitian to make a meal plan that is best for you. Your meal plan may vary depending on factors such as:  The calories you need.  The medicines you take.  Your weight.  Your blood glucose, blood pressure, and cholesterol levels.  Your activity level.  Other health conditions you have, such as heart or kidney disease. How do carbohydrates affect me? Carbohydrates, also called carbs, affect your blood glucose level more than any other type of food. Eating carbs naturally raises the amount of glucose in your blood. Carb counting is a method for keeping track of how many carbs you eat. Counting carbs is important to keep your blood glucose at a healthy level, especially if you use insulin or take certain oral diabetes medicines. It is important to know how many carbs you can safely have in each meal. This is different for every  person. Your dietitian can help you calculate how many  carbs you should have at each meal and for each snack. How does alcohol affect me? Alcohol can cause a sudden decrease in blood glucose (hypoglycemia), especially if you use insulin or take certain oral diabetes medicines. Hypoglycemia can be a life-threatening condition. Symptoms of hypoglycemia, such as sleepiness, dizziness, and confusion, are similar to symptoms of having too much alcohol.  Do not drink alcohol if: ? Your health care provider tells you not to drink. ? You are pregnant, may be pregnant, or are planning to become pregnant.  If you drink alcohol: ? Do not drink on an empty stomach. ? Limit how much you use to:  0-1 drink a day for women.  0-2 drinks a day for men. ? Be aware of how much alcohol is in your drink. In the [place] one drink equals one 12 oz bottle of beer (355 mL), one 5 oz glass of wine (148 mL), or one 1 oz glass of hard liquor (44 mL). ? Keep yourself hydrated with water, diet soda, or unsweetened iced tea.  Keep in mind that regular soda, juice, and other mixers may contain a lot of sugar and must be counted as carbs. What are tips for following this plan? Reading food labels  Start by checking the serving size on the "Nutrition Facts" label of packaged foods and drinks. The amount of calories, carbs, fats, and other nutrients listed on the label is based on one serving of the item. Many items contain more than one serving per package.  Check the total grams (g) of carbs in one serving. You can calculate the number of servings of carbs in one serving by dividing the total carbs by 15. For example, if a food has 30 g of total carbs per serving, it would be equal to 2 servings of carbs.  Check the number of grams (g) of saturated fats and trans fats in one serving. Choose foods that have a low amount or none of these fats.  Check the number of milligrams (mg) of salt (sodium) in one serving. Most people  should limit total sodium intake to less than 2,300 mg per day.  Always check the nutrition information of foods labeled as "low-fat" or "nonfat." These foods may be higher in added sugar or refined carbs and should be avoided.  Talk to your dietitian to identify your daily goals for nutrients listed on the label. Shopping  Avoid buying canned, pre-made, or processed foods. These foods tend to be high in fat, sodium, and added sugar.  Shop around the outside edge of the grocery store. This is where you will most often find fresh fruits and vegetables, bulk grains, fresh meats, and fresh dairy. Cooking  Use low-heat cooking methods, such as baking, instead of high-heat cooking methods like deep frying.  Cook using healthy oils, such as olive, canola, or sunflower oil.  Avoid cooking with butter, cream, or high-fat meats. Meal planning  Eat meals and snacks regularly, preferably at the same times every day. Avoid going long periods of time without eating.  Eat foods that are high in fiber, such as fresh fruits, vegetables, beans, and whole grains. Talk with your dietitian about how many servings of carbs you can eat at each meal.  Eat 4-6 oz (112-168 g) of lean protein each day, such as lean meat, chicken, fish, eggs, or tofu. One ounce (oz) of lean protein is equal to: ? 1 oz (28 g) of meat, chicken, or fish. ? 1 egg. ?  cup (  62 g) of tofu.  Eat some foods each day that contain healthy fats, such as avocado, nuts, seeds, and fish.   What foods should I eat? Fruits Berries. Apples. Oranges. Peaches. Apricots. Plums. Grapes. Mango. Papaya. Pomegranate. Kiwi. Cherries. Vegetables Lettuce. Spinach. Leafy greens, including kale, chard, collard greens, and mustard greens. Beets. Cauliflower. Cabbage. Broccoli. Carrots. Green beans. Tomatoes. Peppers. Onions. Cucumbers. Brussels sprouts. Grains Whole grains, such as whole-wheat or whole-grain bread, crackers, tortillas, cereal, and pasta.  Unsweetened oatmeal. Quinoa. Brown or wild rice. Meats and other proteins Seafood. Poultry without skin. Lean cuts of poultry and beef. Tofu. Nuts. Seeds. Dairy Low-fat or fat-free dairy products such as milk, yogurt, and cheese. The items listed above may not be a complete list of foods and beverages you can eat. Contact a dietitian for more information. What foods should I avoid? Fruits Fruits canned with syrup. Vegetables Canned vegetables. Frozen vegetables with butter or cream sauce. Grains Refined white flour and flour products such as bread, pasta, snack foods, and cereals. Avoid all processed foods. Meats and other proteins Fatty cuts of meat. Poultry with skin. Breaded or fried meats. Processed meat. Avoid saturated fats. Dairy Full-fat yogurt, cheese, or milk. Beverages Sweetened drinks, such as soda or iced tea. The items listed above may not be a complete list of foods and beverages you should avoid. Contact a dietitian for more information. Questions to ask a health care provider  Do I need to meet with a diabetes educator?  Do I need to meet with a dietitian?  What number can I call if I have questions?  When are the best times to check my blood glucose? Where to find more information:  American Diabetes Association: diabetes.org  Academy of Nutrition and Dietetics: www.eatright.[employer] of Diabetes and Digestive and Kidney Diseases: [employer]  Association of Diabetes Care and Education Specialists: www.diabeteseducator.org Summary  It is important to have healthy eating habits because your blood sugar (glucose) levels are greatly affected by what you eat and drink.  A healthy meal plan will help you control your blood glucose and maintain a healthy lifestyle.  Your health care provider may recommend that you work with a dietitian to make a meal plan that is best for you.  Keep in mind that carbohydrates (carbs) and alcohol have  immediate effects on your blood glucose levels. It is important to count carbs and to use alcohol carefully. This information is not intended to replace advice given to you by your health care provider. Make sure you discuss any questions you have with your health care provider. Document Revised: 06/02/2019 Document Reviewed: 06/02/2019 Elsevier Patient Education  2021 Elsevier Inc.      Agustina Caroli, MD [facility]

## 2020-09-08 NOTE — Patient Instructions (Addendum)
   If you have lab work done today you will be contacted with your lab results within the next 2 weeks.  If you have not heard from us then please contact us. The fastest way to get your results is to register for My Chart.   IF you received an x-ray today, you will receive an invoice from [facility]. Please contact [facility] at [phone] with questions or concerns regarding your invoice.   IF you received labwork today, you will receive an invoice from [facility]. Please contact [facility] at [phone] with questions or concerns regarding your invoice.   Our billing staff will not be able to assist you with questions regarding bills from these companies.  You will be contacted with the lab results as soon as they are available. The fastest way to get your results is to activate your My Chart account. Instructions are located on the last page of this paperwork. If you have not heard from us regarding the results in 2 weeks, please contact this office.     Diabetes Mellitus and Nutrition, Adult When you have diabetes, or diabetes mellitus, it is very important to have healthy eating habits because your blood sugar (glucose) levels are greatly affected by what you eat and drink. Eating healthy foods in the right amounts, at about the same times every day, can help you:  Control your blood glucose.  Lower your risk of heart disease.  Improve your blood pressure.  Reach or maintain a healthy weight. What can affect my meal plan? Every person with diabetes is different, and each person has different needs for a meal plan. Your health care provider may recommend that you work with a dietitian to make a meal plan that is best for you. Your meal plan may vary depending on factors such as:  The calories you need.  The medicines you take.  Your weight.  Your blood glucose, blood pressure, and cholesterol levels.  Your activity level.  Other health conditions you  have, such as heart or kidney disease. How do carbohydrates affect me? Carbohydrates, also called carbs, affect your blood glucose level more than any other type of food. Eating carbs naturally raises the amount of glucose in your blood. Carb counting is a method for keeping track of how many carbs you eat. Counting carbs is important to keep your blood glucose at a healthy level, especially if you use insulin or take certain oral diabetes medicines. It is important to know how many carbs you can safely have in each meal. This is different for every person. Your dietitian can help you calculate how many carbs you should have at each meal and for each snack. How does alcohol affect me? Alcohol can cause a sudden decrease in blood glucose (hypoglycemia), especially if you use insulin or take certain oral diabetes medicines. Hypoglycemia can be a life-threatening condition. Symptoms of hypoglycemia, such as sleepiness, dizziness, and confusion, are similar to symptoms of having too much alcohol.  Do not drink alcohol if: ? Your health care provider tells you not to drink. ? You are pregnant, may be pregnant, or are planning to become pregnant.  If you drink alcohol: ? Do not drink on an empty stomach. ? Limit how much you use to:  0-1 drink a day for women.  0-2 drinks a day for men. ? Be aware of how much alcohol is in your drink. In the [place] one drink equals one 12 oz bottle of beer (355 mL),   one 5 oz glass of wine (148 mL), or one 1 oz glass of hard liquor (44 mL). ? Keep yourself hydrated with water, diet soda, or unsweetened iced tea.  Keep in mind that regular soda, juice, and other mixers may contain a lot of sugar and must be counted as carbs. What are tips for following this plan? Reading food labels  Start by checking the serving size on the "Nutrition Facts" label of packaged foods and drinks. The amount of calories, carbs, fats, and other nutrients listed on the label is based on  one serving of the item. Many items contain more than one serving per package.  Check the total grams (g) of carbs in one serving. You can calculate the number of servings of carbs in one serving by dividing the total carbs by 15. For example, if a food has 30 g of total carbs per serving, it would be equal to 2 servings of carbs.  Check the number of grams (g) of saturated fats and trans fats in one serving. Choose foods that have a low amount or none of these fats.  Check the number of milligrams (mg) of salt (sodium) in one serving. Most people should limit total sodium intake to less than 2,300 mg per day.  Always check the nutrition information of foods labeled as "low-fat" or "nonfat." These foods may be higher in added sugar or refined carbs and should be avoided.  Talk to your dietitian to identify your daily goals for nutrients listed on the label. Shopping  Avoid buying canned, pre-made, or processed foods. These foods tend to be high in fat, sodium, and added sugar.  Shop around the outside edge of the grocery store. This is where you will most often find fresh fruits and vegetables, bulk grains, fresh meats, and fresh dairy. Cooking  Use low-heat cooking methods, such as baking, instead of high-heat cooking methods like deep frying.  Cook using healthy oils, such as olive, canola, or sunflower oil.  Avoid cooking with butter, cream, or high-fat meats. Meal planning  Eat meals and snacks regularly, preferably at the same times every day. Avoid going long periods of time without eating.  Eat foods that are high in fiber, such as fresh fruits, vegetables, beans, and whole grains. Talk with your dietitian about how many servings of carbs you can eat at each meal.  Eat 4-6 oz (112-168 g) of lean protein each day, such as lean meat, chicken, fish, eggs, or tofu. One ounce (oz) of lean protein is equal to: ? 1 oz (28 g) of meat, chicken, or fish. ? 1 egg. ?  cup (62 g) of  tofu.  Eat some foods each day that contain healthy fats, such as avocado, nuts, seeds, and fish.   What foods should I eat? Fruits Berries. Apples. Oranges. Peaches. Apricots. Plums. Grapes. Mango. Papaya. Pomegranate. Kiwi. Cherries. Vegetables Lettuce. Spinach. Leafy greens, including kale, chard, collard greens, and mustard greens. Beets. Cauliflower. Cabbage. Broccoli. Carrots. Green beans. Tomatoes. Peppers. Onions. Cucumbers. Brussels sprouts. Grains Whole grains, such as whole-wheat or whole-grain bread, crackers, tortillas, cereal, and pasta. Unsweetened oatmeal. Quinoa. Brown or wild rice. Meats and other proteins Seafood. Poultry without skin. Lean cuts of poultry and beef. Tofu. Nuts. Seeds. Dairy Low-fat or fat-free dairy products such as milk, yogurt, and cheese. The items listed above may not be a complete list of foods and beverages you can eat. Contact a dietitian for more information. What foods should I avoid? Fruits Fruits canned   with syrup. Vegetables Canned vegetables. Frozen vegetables with butter or cream sauce. Grains Refined white flour and flour products such as bread, pasta, snack foods, and cereals. Avoid all processed foods. Meats and other proteins Fatty cuts of meat. Poultry with skin. Breaded or fried meats. Processed meat. Avoid saturated fats. Dairy Full-fat yogurt, cheese, or milk. Beverages Sweetened drinks, such as soda or iced tea. The items listed above may not be a complete list of foods and beverages you should avoid. Contact a dietitian for more information. Questions to ask a health care provider  Do I need to meet with a diabetes educator?  Do I need to meet with a dietitian?  What number can I call if I have questions?  When are the best times to check my blood glucose? Where to find more information:  American Diabetes Association: diabetes.org  Academy of Nutrition and Dietetics: www.eatright.org  National Institute of  Diabetes and Digestive and Kidney Diseases: www.niddk.nih.gov  Association of Diabetes Care and Education Specialists: www.diabeteseducator.org Summary  It is important to have healthy eating habits because your blood sugar (glucose) levels are greatly affected by what you eat and drink.  A healthy meal plan will help you control your blood glucose and maintain a healthy lifestyle.  Your health care provider may recommend that you work with a dietitian to make a meal plan that is best for you.  Keep in mind that carbohydrates (carbs) and alcohol have immediate effects on your blood glucose levels. It is important to count carbs and to use alcohol carefully. This information is not intended to replace advice given to you by your health care provider. Make sure you discuss any questions you have with your health care provider. Document Revised: 06/02/2019 Document Reviewed: 06/02/2019 Elsevier Patient Education  2021 Elsevier Inc.  

## 2020-09-09 LAB — CMP14+EGFR
ALT: 28 IU/L (ref 0–44)
AST: 23 IU/L (ref 0–40)
Albumin/Globulin Ratio: 1.7 (ref 1.2–2.2)
Albumin: 4.5 g/dL (ref 3.8–4.8)
Alkaline Phosphatase: 106 IU/L (ref 44–121)
BUN/Creatinine Ratio: 13 (ref 10–24)
BUN: 18 mg/dL (ref 8–27)
Bilirubin Total: 0.3 mg/dL (ref 0.0–1.2)
CO2: 20 mmol/L (ref 20–29)
Calcium: 9.7 mg/dL (ref 8.6–10.2)
Chloride: 101 mmol/L (ref 96–106)
Creatinine, Ser: 1.4 mg/dL — ABNORMAL HIGH (ref 0.76–1.27)
Globulin, Total: 2.6 g/dL (ref 1.5–4.5)
Glucose: 154 mg/dL — ABNORMAL HIGH (ref 65–99)
Potassium: 4.7 mmol/L (ref 3.5–5.2)
Sodium: 142 mmol/L (ref 134–144)
Total Protein: 7.1 g/dL (ref 6.0–8.5)
eGFR: 57 mL/min/{1.73_m2} — ABNORMAL LOW (ref >59)

## 2020-09-09 LAB — LIPID PANEL
Chol/HDL Ratio: 5.2 ratio — ABNORMAL HIGH (ref 0.0–5.0)
Cholesterol, Total: 165 mg/dL (ref 100–199)
HDL: 32 mg/dL — ABNORMAL LOW (ref >39)
LDL Chol Calc (NIH): 88 mg/dL (ref 0–99)
Triglycerides: 271 mg/dL — ABNORMAL HIGH (ref 0–149)
VLDL Cholesterol Cal: 45 mg/dL — ABNORMAL HIGH (ref 5–40)

## 2020-09-27 ENCOUNTER — Telehealth: Payer: Self-pay | Admitting: Emergency Medicine

## 2020-09-27 NOTE — Telephone Encounter (Signed)
Patient is calling to update his pharmacy is [facility]  phone number is [phone]  They are needing a list of his currnet medicines  potassium citrate (UROCIT-K) 10 MEQ (1080 MG) SR tablet [[id number]]  And  metFORMIN (GLUCOPHAGE) 1000 MG tablet  And  olmesartan-hydrochlorothiazide (BENICAR HCT) 20-12.5 MG tablet [[id number]]   And  pioglitazone (ACTOS) 30 MG tablet [[id number]     He needs these rx transferred to sent to  New mail order pharmacy    Please advise

## 2020-09-28 MED ORDER — METFORMIN HCL 1000 MG PO TABS: 1000.0000 mg | ORAL_TABLET | Freq: Two times a day (BID) | ORAL | 0 refills | Status: DC

## 2020-09-28 MED ORDER — PIOGLITAZONE HCL 30 MG PO TABS: 30.0000 mg | ORAL_TABLET | Freq: Every day | ORAL | 1 refills | Status: DC

## 2020-09-28 MED ORDER — POTASSIUM CITRATE ER 10 MEQ (1080 MG) PO TBCR: 10.0000 meq | EXTENDED_RELEASE_TABLET | Freq: Two times a day (BID) | ORAL | 0 refills | Status: DC

## 2020-09-28 MED ORDER — OLMESARTAN MEDOXOMIL-HCTZ 20-12.5 MG PO TABS: 1.0000 | ORAL_TABLET | Freq: Every day | ORAL | 1 refills | Status: DC

## 2020-11-17 ENCOUNTER — Ambulatory Visit (INDEPENDENT_AMBULATORY_CARE_PROVIDER_SITE_OTHER): Payer: Medicare Other | Admitting: Emergency Medicine

## 2020-11-17 ENCOUNTER — Encounter: Payer: Self-pay | Admitting: Emergency Medicine

## 2020-11-17 ENCOUNTER — Other Ambulatory Visit: Payer: Self-pay

## 2020-11-17 VITALS — BP 128/86 | HR 94 | Temp 98.6°F | Ht 63.0 in | Wt 242.0 lb

## 2020-11-17 DIAGNOSIS — K219 Gastro-esophageal reflux disease without esophagitis: Secondary | ICD-10-CM

## 2020-11-17 DIAGNOSIS — F840 Autistic disorder: Secondary | ICD-10-CM

## 2020-11-17 DIAGNOSIS — E785 Hyperlipidemia, unspecified: Secondary | ICD-10-CM | POA: Diagnosis not present

## 2020-11-17 DIAGNOSIS — E1169 Type 2 diabetes mellitus with other specified complication: Secondary | ICD-10-CM | POA: Diagnosis not present

## 2020-11-17 DIAGNOSIS — I10 Essential (primary) hypertension: Secondary | ICD-10-CM | POA: Diagnosis not present

## 2020-11-17 LAB — POCT GLYCOSYLATED HEMOGLOBIN (HGB A1C): Hemoglobin A1C: 7.8 % — AB (ref 4.0–5.6)

## 2020-11-17 MED ORDER — PANTOPRAZOLE SODIUM 40 MG PO TBEC: 40.0000 mg | DELAYED_RELEASE_TABLET | Freq: Every day | ORAL | 3 refills | Status: DC

## 2020-11-17 NOTE — Patient Instructions (Signed)
Food Choices for Gastroesophageal Reflux Disease, Adult When you have gastroesophageal reflux disease (GERD), the foods you eat and your eating habits are very important. Choosing the right foods can help ease your discomfort. Think about working with a food expert (dietitian) to help you make good choices. What are tips for following this plan? Reading food labels  Look for foods that are low in saturated fat. Foods that may help with your symptoms include: ? Foods that have less than 5% of daily value (DV) of fat. ? Foods that have 0 grams of trans fat. Cooking  Do not fry your food.  Cook your food by baking, steaming, grilling, or broiling. These are all methods that do not need a lot of fat for cooking.  To add flavor, try to use herbs that are low in spice and acidity. Meal planning  Choose healthy foods that are low in fat, such as: ? Fruits and vegetables. ? Whole grains. ? Low-fat dairy products. ? Lean meats, fish, and poultry.  Eat small meals often instead of eating 3 large meals each day. Eat your meals slowly in a place where you are relaxed. Avoid bending over or lying down until 2-3 hours after eating.  Limit high-fat foods such as fatty meats or fried foods.  Limit your intake of fatty foods, such as oils, butter, and shortening.  Avoid the following as told by your doctor: ? Foods that cause symptoms. These may be different for different people. Keep a food diary to keep track of foods that cause symptoms. ? Alcohol. ? Drinking a lot of liquid with meals. ? Eating meals during the 2-3 hours before bed.   Lifestyle  Stay at a healthy weight. Ask your doctor what weight is healthy for you. If you need to lose weight, work with your doctor to do so safely.  Exercise for at least 30 minutes on 5 or more days each week, or as told by your doctor.  Wear loose-fitting clothes.  Do not smoke or use any products that contain nicotine or tobacco. If you need help  quitting, ask your doctor.  Sleep with the head of your bed higher than your feet. Use a wedge under the mattress or blocks under the bed frame to raise the head of the bed.  Chew sugar-free gum after meals. What foods should eat? Eat a healthy, well-balanced diet of fruits, vegetables, whole grains, low-fat dairy products, lean meats, fish, and poultry. Each person is different. Foods that may cause symptoms in one person may not cause any symptoms in another person. Work with your doctor to find foods that are safe for you. The items listed above may not be a complete list of what you can eat and drink. Contact a food expert for more options.   What foods should I avoid? Limiting some of these foods may help in managing the symptoms of GERD. Everyone is different. Talk with a food expert or your doctor to help you find the exact foods to avoid, if any. Fruits Any fruits prepared with added fat. Any fruits that cause symptoms. For some people, this may include citrus fruits, such as oranges, grapefruit, pineapple, and lemons. Vegetables Deep-fried vegetables. French fries. Any vegetables prepared with added fat. Any vegetables that cause symptoms. For some people, this may include tomatoes and tomato products, chili peppers, onions and garlic, and horseradish. Grains Pastries or quick breads with added fat. Meats and other proteins High-fat meats, such as fatty beef or pork,   hot dogs, ribs, ham, sausage, salami, and bacon. Fried meat or protein, including fried fish and fried chicken. Nuts and nut butters, in large amounts. Dairy Whole milk and chocolate milk. Sour cream. Cream. Ice cream. Cream cheese. Milkshakes. Fats and oils Butter. Margarine. Shortening. Ghee. Beverages Coffee and tea, with or without caffeine. Carbonated beverages. Sodas. Energy drinks. Fruit juice made with acidic fruits, such as orange or grapefruit. Tomato juice. Alcoholic drinks. Sweets and desserts Chocolate and  cocoa. Donuts. Seasonings and condiments Pepper. Peppermint and spearmint. Added salt. Any condiments, herbs, or seasonings that cause symptoms. For some people, this may include curry, hot sauce, or vinegar-based salad dressings. The items listed above may not be a complete list of what you should not eat and drink. Contact a food expert for more options. Questions to ask your doctor Diet and lifestyle changes are often the first steps that are taken to manage symptoms of GERD. If diet and lifestyle changes do not help, talk with your doctor about taking medicines. Where to find more information  International Foundation for Gastrointestinal Disorders: aboutgerd.org Summary  When you have GERD, food and lifestyle choices are very important in easing your symptoms.  Eat small meals often instead of 3 large meals a day. Eat your meals slowly and in a place where you are relaxed.  Avoid bending over or lying down until 2-3 hours after eating.  Limit high-fat foods such as fatty meats or fried foods. This information is not intended to replace advice given to you by your health care provider. Make sure you discuss any questions you have with your health care provider. Document Revised: 01/04/2020 Document Reviewed: 01/04/2020 Elsevier Patient Education  2021 [employer] Inc.  

## 2020-11-17 NOTE — Progress Notes (Signed)
Lab Results  Component Value Date   HGBA1C 8.8 (A) 09/08/2020   BP Readings from Last 3 Encounters:  09/08/20 135/83  12/01/19 129/72  09/03/19 138/89   Lab Results  Component Value Date   CHOL 165 09/08/2020   HDL 32 (L) 09/08/2020   LDLCALC 88 09/08/2020   TRIG 271 (H) 09/08/2020   CHOLHDL 5.2 (H) 09/08/2020   The 10-year ASCVD risk score Denman George DC Jr., et al., 2013) is: 24.7%   Values used to calculate the score:     Age: 60 years     Sex: Male     Is Non-Hispanic African American: No     Diabetic: Yes     Tobacco smoker: No     Systolic Blood Pressure: 135 mmHg     Is BP treated: Yes     HDL Cholesterol: 32 mg/dL     Total Cholesterol: 165 mg/dL Julian Reyes 59 y.o.   Chief Complaint  Patient presents with  . Diabetes    Follow up, discuss stomach medication    HISTORY OF PRESENT ILLNESS: This is a 60 y.o. male autistic patient with history of diabetes and GERD here for evaluation of reflux symptoms. Accompanied by brother who helps with the history.  No other significant symptoms. No other complaints or medical concerns today.  HPI   Prior to Admission medications   Medication Sig Start Date End Date Taking? Authorizing Provider  amLODipine (NORVASC) 10 MG tablet Take 1 tablet (10 mg total) by mouth daily. 06/08/20  Yes Jermery Caratachea, Eilleen Kempf, MD  atorvastatin (LIPITOR) 40 MG tablet TAKE 1 TABLET BY MOUTH ONCE DAILY IN THE MORNING 12/01/19  Yes Lezlie Lye, Meda Coffee, MD  cholecalciferol (VITAMIN D3) 25 MCG (1000 UT) tablet Take 1 tablet (1,000 Units total) by mouth daily. 06/01/19  Yes Lezlie Lye, Meda Coffee, MD  empagliflozin (JARDIANCE) 25 MG TABS tablet Take 1 tablet (25 mg total) by mouth daily. 06/29/20  Yes Dreux Mcgroarty, Eilleen Kempf, MD  glipiZIDE (GLUCOTROL) 5 MG tablet Take 1 tablet (5 mg total) by mouth 2 (two) times daily before a meal. 09/08/20 12/07/20 Yes Elza Sortor, Eilleen Kempf, MD  glucose blood (ACCU-CHEK AVIVA PLUS) test strip USE 1 STRIP TO CHECK GLUCOSE  TWICE DAILY 05/22/18  Yes Arnette Felts, FNP  Lancets (ACCU-CHEK SOFT TOUCH) lancets Use as instructed 04/02/18  Yes Lezlie Lye, Meda Coffee, MD  metFORMIN (GLUCOPHAGE) 1000 MG tablet Take 1 tablet (1,000 mg total) by mouth 2 (two) times daily with a meal. 09/28/20  Yes Briany Aye, Eilleen Kempf, MD  olmesartan-hydrochlorothiazide (BENICAR HCT) 20-12.5 MG tablet Take 1 tablet by mouth daily. 09/28/20  Yes Lorriann Hansmann, Eilleen Kempf, MD  pioglitazone (ACTOS) 30 MG tablet Take 1 tablet (30 mg total) by mouth daily. 09/28/20  Yes Zahriah Roes, Eilleen Kempf, MD  potassium citrate (UROCIT-K) 10 MEQ (1080 MG) SR tablet Take 1 tablet (10 mEq total) by mouth 2 (two) times daily. 09/28/20  Yes Kassity Woodson, Eilleen Kempf, MD  triamcinolone cream (KENALOG) 0.1 % Apply 1 application topically 2 (two) times daily. 06/01/19  Yes Lezlie Lye, Meda Coffee, MD    Allergies  Allergen Reactions  . Penicillins Rash    Patient Active Problem List   Diagnosis Date Noted  . Hyperlipidemia associated with type 2 diabetes mellitus (HCC) 09/08/2020  . Dental disease 06/28/2016  . Autism disorder 04/05/2014  . Gastroesophageal reflux disease without esophagitis 04/05/2014  . HTN (hypertension), benign 04/05/2014  . Hyperlipidemia 04/05/2014  . Type 2 diabetes mellitus without complication (  HCC) 04/05/2014    Past Medical History:  Diagnosis Date  . Autism 07-18-11   pt. moans and makes verbal sounds,doesn't talk  . Chronic kidney disease 07-18-11   kidney stones  . Diabetes mellitus     Past Surgical History:  Procedure Laterality Date  . CYSTOSCOPY W/ URETERAL STENT PLACEMENT  07/12/2011   Procedure: CYSTOSCOPY WITH RETROGRADE PYELOGRAM/URETERAL STENT PLACEMENT;  Surgeon: Crecencio McLes Borden, MD;  Location: [facility];  Service: Urology;  Laterality: Right;  . CYSTOSCOPY W/ URETERAL STENT REMOVAL  08/06/2011   Procedure: CYSTOSCOPY WITH STENT REMOVAL;  Surgeon: Crecencio McLes Borden, MD;  Location: [facility];  Service: Urology;  Laterality: Right;  Cystscopy with  Right Ureteral Stent Removal   . CYSTOSCOPY/RETROGRADE/URETEROSCOPY/STONE EXTRACTION WITH BASKET  07/19/2011   Procedure: CYSTOSCOPY/RETROGRADE/URETEROSCOPY/STONE EXTRACTION WITH BASKET;  Surgeon: Crecencio McLes Borden, MD;  Location: [facility];  Service: Urology;  Laterality: Right;  . ingrown toenails      Social History   Socioeconomic History  . Marital status: Single    Spouse name: Not on file  . Number of children: Not on file  . Years of education: Not on file  . Highest education level: Not on file  Occupational History  . Not on file  Tobacco Use  . Smoking status: Never Smoker  . Smokeless tobacco: Never Used  Substance and Sexual Activity  . Alcohol use: No  . Drug use: No  . Sexual activity: Never  Other Topics Concern  . Not on file  Social History Narrative  . Not on file   Social Determinants of Health   Financial Resource Strain: Not on file  Food Insecurity: Not on file  Transportation Needs: Not on file  Physical Activity: Not on file  Stress: Not on file  Social Connections: Not on file  Intimate Partner Violence: Not on file    History reviewed. No pertinent family history.   Review of Systems  Constitutional: Negative.  Negative for chills and fever.  HENT: Negative.  Negative for congestion and sore throat.   Respiratory: Negative.  Negative for cough and shortness of breath.   Cardiovascular: Negative for chest pain.  Gastrointestinal: Negative for abdominal pain, diarrhea, nausea and vomiting.  Genitourinary: Negative for hematuria.  Skin: Negative.  Negative for rash.  All other systems reviewed and are negative.    Physical Exam Vitals reviewed.  Constitutional:      Appearance: He is obese.  HENT:     Head: Normocephalic.  Eyes:     Extraocular Movements: Extraocular movements intact.     Pupils: Pupils are equal, round, and reactive to light.  Cardiovascular:     Rate and Rhythm: Normal rate and regular rhythm.     Pulses: Normal pulses.      Heart sounds: Normal heart sounds.  Pulmonary:     Effort: Pulmonary effort is normal.     Breath sounds: Normal breath sounds.  Abdominal:     Palpations: Abdomen is soft.     Tenderness: There is no abdominal tenderness.  Musculoskeletal:     Cervical back: Normal range of motion.  Skin:    General: Skin is warm and dry.  Neurological:     General: No focal deficit present.     Mental Status: He is alert.      Results for orders placed or performed in visit on 11/17/20 (from the past 24 hour(s))  POCT glycosylated hemoglobin (Hb A1C)     Status: Abnormal   Collection Time: 11/17/20  1:13  PM  Result Value Ref Range   Hemoglobin A1C 7.8 (A) 4.0 - 5.6 %   HbA1c POC (<> result, manual entry)     HbA1c, POC (prediabetic range)     HbA1c, POC (controlled diabetic range)       ASSESSMENT & PLAN: A total of 30 minutes was spent with the patient and counseling/coordination of care regarding GERD with treatment and food choices, diabetes and treatment, review of all medications and changes made including discontinuation of Actos and starting pantoprazole, side effects of medications, education on nutrition, review of most recent office visit notes, review of most recent blood work results including today's hemoglobin A1c, prognosis, documentation, need for follow-up.  Gastroesophageal reflux disease without esophagitis Active.  We will start pantoprazole 40 mg daily. Foods to avoid GERD discussed with brother.  Hyperlipidemia associated with type 2 diabetes mellitus (HCC) Continue atorvastatin 40 mg daily.  Diet and nutrition discussed. Improved diabetes with hemoglobin A1c at 7.8, better than before. Will continue Jardiance 25 mg daily, metformin 1000 mg twice a day and glipizide 5 mg twice a day. Will discontinue Actos. Follow-up in 3 months.  Liliana was seen today for diabetes.  Diagnoses and all orders for this visit:  Gastroesophageal reflux disease without  esophagitis -     pantoprazole (PROTONIX) 40 MG tablet; Take 1 tablet (40 mg total) by mouth daily.  HTN (hypertension), benign  Hyperlipidemia associated with type 2 diabetes mellitus (HCC) -     POCT glycosylated hemoglobin (Hb A1C)  Autism disorder    Patient Instructions   Food Choices for Gastroesophageal Reflux Disease, Adult When you have gastroesophageal reflux disease (GERD), the foods you eat and your eating habits are very important. Choosing the right foods can help ease your discomfort. Think about working with a food expert (dietitian) to help you make good choices. What are tips for following this plan? Reading food labels  Look for foods that are low in saturated fat. Foods that may help with your symptoms include: ? Foods that have less than 5% of daily value (DV) of fat. ? Foods that have 0 grams of trans fat. Cooking  Do not fry your food.  Cook your food by baking, steaming, grilling, or broiling. These are all methods that do not need a lot of fat for cooking.  To add flavor, try to use herbs that are low in spice and acidity. Meal planning  Choose healthy foods that are low in fat, such as: ? Fruits and vegetables. ? Whole grains. ? Low-fat dairy products. ? Lean meats, fish, and poultry.  Eat small meals often instead of eating 3 large meals each day. Eat your meals slowly in a place where you are relaxed. Avoid bending over or lying down until 2-3 hours after eating.  Limit high-fat foods such as fatty meats or fried foods.  Limit your intake of fatty foods, such as oils, butter, and shortening.  Avoid the following as told by your doctor: ? Foods that cause symptoms. These may be different for different people. Keep a food diary to keep track of foods that cause symptoms. ? Alcohol. ? Drinking a lot of liquid with meals. ? Eating meals during the 2-3 hours before bed.   Lifestyle  Stay at a healthy weight. Ask your doctor what weight is  healthy for you. If you need to lose weight, work with your doctor to do so safely.  Exercise for at least 30 minutes on 5 or more days each  week, or as told by your doctor.  Wear loose-fitting clothes.  Do not smoke or use any products that contain nicotine or tobacco. If you need help quitting, ask your doctor.  Sleep with the head of your bed higher than your feet. Use a wedge under the mattress or blocks under the bed frame to raise the head of the bed.  Chew sugar-free gum after meals. What foods should eat? Eat a healthy, well-balanced diet of fruits, vegetables, whole grains, low-fat dairy products, lean meats, fish, and poultry. Each person is different. Foods that may cause symptoms in one person may not cause any symptoms in another person. Work with your doctor to find foods that are safe for you. The items listed above may not be a complete list of what you can eat and drink. Contact a food expert for more options.   What foods should I avoid? Limiting some of these foods may help in managing the symptoms of GERD. Everyone is different. Talk with a food expert or your doctor to help you find the exact foods to avoid, if any. Fruits Any fruits prepared with added fat. Any fruits that cause symptoms. For some people, this may include citrus fruits, such as oranges, grapefruit, pineapple, and lemons. Vegetables Deep-fried vegetables. [place] fries. Any vegetables prepared with added fat. Any vegetables that cause symptoms. For some people, this may include tomatoes and tomato products, chili peppers, onions and garlic, and horseradish. Grains Pastries or quick breads with added fat. Meats and other proteins High-fat meats, such as fatty beef or pork, hot dogs, ribs, ham, sausage, salami, and bacon. Fried meat or protein, including fried fish and fried chicken. Nuts and nut butters, in large amounts. Dairy Whole milk and chocolate milk. Sour cream. Cream. Ice cream. Cream cheese.  Milkshakes. Fats and oils Butter. Margarine. Shortening. Ghee. Beverages Coffee and tea, with or without caffeine. Carbonated beverages. Sodas. Energy drinks. Fruit juice made with acidic fruits, such as orange or grapefruit. Tomato juice. Alcoholic drinks. Sweets and desserts Chocolate and cocoa. Donuts. Seasonings and condiments Pepper. Peppermint and spearmint. Added salt. Any condiments, herbs, or seasonings that cause symptoms. For some people, this may include curry, hot sauce, or vinegar-based salad dressings. The items listed above may not be a complete list of what you should not eat and drink. Contact a food expert for more options. Questions to ask your doctor Diet and lifestyle changes are often the first steps that are taken to manage symptoms of GERD. If diet and lifestyle changes do not help, talk with your doctor about taking medicines. Where to find more information  International Foundation for Gastrointestinal Disorders: aboutgerd.org Summary  When you have GERD, food and lifestyle choices are very important in easing your symptoms.  Eat small meals often instead of 3 large meals a day. Eat your meals slowly and in a place where you are relaxed.  Avoid bending over or lying down until 2-3 hours after eating.  Limit high-fat foods such as fatty meats or fried foods. This information is not intended to replace advice given to you by your health care provider. Make sure you discuss any questions you have with your health care provider. Document Revised: 01/04/2020 Document Reviewed: 01/04/2020 Elsevier Patient Education  2021 Elsevier Inc.     Edwina Barth, MD [facility]

## 2020-11-17 NOTE — Assessment & Plan Note (Signed)
Active.  We will start pantoprazole 40 mg daily. Foods to avoid GERD discussed with brother.

## 2020-11-17 NOTE — Assessment & Plan Note (Signed)
Continue atorvastatin 40 mg daily.  Diet and nutrition discussed. Improved diabetes with hemoglobin A1c at 7.8, better than before. Will continue Jardiance 25 mg daily, metformin 1000 mg twice a day and glipizide 5 mg twice a day. Will discontinue Actos. Follow-up in 3 months.

## 2020-11-29 ENCOUNTER — Other Ambulatory Visit: Payer: Self-pay | Admitting: Emergency Medicine

## 2021-01-03 ENCOUNTER — Telehealth: Payer: Self-pay | Admitting: Emergency Medicine

## 2021-01-03 NOTE — Telephone Encounter (Signed)
1.Medication Requested: atorvastatin (LIPITOR) 40 MG tablet  empagliflozin (JARDIANCE) 25 MG TABS tablet  2. Pharmacy (Name, Street, [place]): [facility]  [facility]) - [place], [place] - [address]  3. On Med List: yes   4. Last Visit with PCP: 11-17-20  5. Next visit date with PCP: n/a    Agent: Please be advised that RX refills may take up to 3 business days. We ask that you follow-up with your pharmacy.

## 2021-01-04 ENCOUNTER — Other Ambulatory Visit: Payer: Self-pay | Admitting: *Deleted

## 2021-01-04 DIAGNOSIS — E785 Hyperlipidemia, unspecified: Secondary | ICD-10-CM

## 2021-01-04 MED ORDER — ATORVASTATIN CALCIUM 40 MG PO TABS: ORAL_TABLET | ORAL | 3 refills | Status: DC

## 2021-01-04 MED ORDER — EMPAGLIFLOZIN 25 MG PO TABS: 25.0000 mg | ORAL_TABLET | Freq: Every day | ORAL | 1 refills | Status: DC

## 2021-01-04 NOTE — Telephone Encounter (Signed)
Lipitor 40 mg and Jardiance 25 mg refill sent to [facility]. Patient notified.

## 2021-01-06 ENCOUNTER — Other Ambulatory Visit: Payer: Self-pay | Admitting: *Deleted

## 2021-01-06 DIAGNOSIS — K219 Gastro-esophageal reflux disease without esophagitis: Secondary | ICD-10-CM

## 2021-01-06 MED ORDER — PANTOPRAZOLE SODIUM 40 MG PO TBEC: 40.0000 mg | DELAYED_RELEASE_TABLET | Freq: Every day | ORAL | 0 refills | Status: DC

## 2021-01-27 ENCOUNTER — Other Ambulatory Visit: Payer: Self-pay | Admitting: Emergency Medicine

## 2021-01-30 ENCOUNTER — Other Ambulatory Visit: Payer: Self-pay | Admitting: Emergency Medicine

## 2021-02-03 ENCOUNTER — Telehealth: Payer: Self-pay

## 2021-02-03 ENCOUNTER — Other Ambulatory Visit: Payer: Self-pay

## 2021-02-03 DIAGNOSIS — I10 Essential (primary) hypertension: Secondary | ICD-10-CM

## 2021-02-03 MED ORDER — OLMESARTAN MEDOXOMIL-HCTZ 20-12.5 MG PO TABS
1.0000 | ORAL_TABLET | Freq: Every day | ORAL | 1 refills | Status: DC
Start: 2021-02-03 — End: 2021-06-29

## 2021-02-06 ENCOUNTER — Telehealth: Payer: Self-pay | Admitting: Emergency Medicine

## 2021-02-06 NOTE — Telephone Encounter (Signed)
Brother Misty Stanley) of patient says father wrote down medicine name: Potacitrate  Says patient is waiting for Dr to send this prescription into Pharmacy.  Did not see rx listed her current meds.Marland Kitchenadvised I would send message to Dr & nurse .Marland Kitchen Misty Stanley would like a callback [phone]  Pharmacy: [facility]  [facility]) - [place], [place] - [address]  Phone:  [phone] Fax:  [phone]

## 2021-02-08 NOTE — Telephone Encounter (Signed)
Spoke with patient's brother and he states that pt needs a refill of potassium citrate. Please advise if patient is supposed to still take this.

## 2021-02-08 NOTE — Telephone Encounter (Signed)
No, he is not supposed to continue potassium citrate.  Thank you.

## 2021-03-02 NOTE — Telephone Encounter (Signed)
Unable to reach patient's brother. Left message to call me back

## 2021-03-14 ENCOUNTER — Telehealth: Payer: Self-pay | Admitting: Emergency Medicine

## 2021-03-14 NOTE — Telephone Encounter (Signed)
I think he is due for his 71-month follow-up with me.  Please double check because he needs to be seen in the office.  Thanks.

## 2021-03-14 NOTE — Telephone Encounter (Signed)
Patient brother calling on his behalf  Says he had to call EMS yesterday bc patient bloodsugar was extremely high  Says he checked it this morning & it was over 300 but once patient ate & had meds it went down to around 200  Wants to know if he needs to change patient dosage on meds or if he needs to bring patient in for a visit  Please fu [phone]

## 2021-03-15 NOTE — Telephone Encounter (Signed)
Called and left a vm to call back to schedule OV.

## 2021-03-15 NOTE — Telephone Encounter (Signed)
[facility] FYI... 03/13/2021   Caller is a paramedic with [employer]. States she is with a pt that has a severe intellectual disability and is blood sugar is 302. It was over 500 and he has had 6 Glipizide . He is refusing to go to ER.  Advised to contact PCP.   Called pt to schedule OV. Pt's brother stated his sugar levels are in good range now. Schedule OV for 03/20/2021.

## 2021-03-17 NOTE — Telephone Encounter (Signed)
Patient has an OV for 03/20/21, to discuss blood sugar concerns.

## 2021-03-20 ENCOUNTER — Other Ambulatory Visit: Payer: Self-pay

## 2021-03-20 ENCOUNTER — Ambulatory Visit (INDEPENDENT_AMBULATORY_CARE_PROVIDER_SITE_OTHER): Payer: Medicare Other | Admitting: Emergency Medicine

## 2021-03-20 ENCOUNTER — Encounter: Payer: Self-pay | Admitting: Emergency Medicine

## 2021-03-20 VITALS — BP 138/70 | HR 84 | Ht 63.0 in | Wt 237.0 lb

## 2021-03-20 DIAGNOSIS — Z23 Encounter for immunization: Secondary | ICD-10-CM | POA: Diagnosis not present

## 2021-03-20 DIAGNOSIS — E1165 Type 2 diabetes mellitus with hyperglycemia: Secondary | ICD-10-CM | POA: Diagnosis not present

## 2021-03-20 DIAGNOSIS — E1159 Type 2 diabetes mellitus with other circulatory complications: Secondary | ICD-10-CM | POA: Diagnosis not present

## 2021-03-20 DIAGNOSIS — E785 Hyperlipidemia, unspecified: Secondary | ICD-10-CM | POA: Diagnosis not present

## 2021-03-20 DIAGNOSIS — E1169 Type 2 diabetes mellitus with other specified complication: Secondary | ICD-10-CM | POA: Diagnosis not present

## 2021-03-20 DIAGNOSIS — I152 Hypertension secondary to endocrine disorders: Secondary | ICD-10-CM

## 2021-03-20 LAB — POCT GLYCOSYLATED HEMOGLOBIN (HGB A1C): Hemoglobin A1C: 9.2 % — AB (ref 4.0–5.6)

## 2021-03-20 MED ORDER — TRULICITY 0.75 MG/0.5ML ~~LOC~~ SOAJ: 0.7500 mg | SUBCUTANEOUS | 5 refills | Status: DC

## 2021-03-20 NOTE — Assessment & Plan Note (Signed)
Uncontrolled diabetes with hemoglobin A1c higher than before at 9.2. Continue metformin 1000 mg twice a day, glipizide 5 mg twice a day and Jardiance 10 mg daily Will start Trulicity 0.75 mg weekly or equivalent depending on medical insurance's formulary.  Demonstration pen used with brother who will be administering this medication. Diet and nutrition discussed again. Follow-up in 3 months. Continue atorvastatin 40 mg daily.

## 2021-03-20 NOTE — Assessment & Plan Note (Signed)
Well-controlled hypertension.  Continue Benicar-HCTZ 20-12.5 mg and amlodipine 10 mg daily. Follow-up in 3 months.

## 2021-03-20 NOTE — Progress Notes (Signed)
Lab Results  Component Value Date   HGBA1C 7.8 (A) 11/17/2020   Lab Results  Component Value Date   CREATININE 1.40 (H) 09/08/2020   BUN 18 09/08/2020   NA 142 09/08/2020   K 4.7 09/08/2020   CL 101 09/08/2020   CO2 20 09/08/2020   BP Readings from Last 3 Encounters:  11/17/20 128/86  09/08/20 135/83  12/01/19 129/72   Wt Readings from Last 3 Encounters:  11/17/20 242 lb (109.8 kg)  09/08/20 239 lb (108.4 kg)  12/01/19 235 lb (106.6 kg)  Julian Reyes 60 y.o.   Chief Complaint  Patient presents with   Diabetes    Pt states blood sugar has been higher than normal    HISTORY OF PRESENT ILLNESS: This is a 60 y.o. male with history of diabetes and autism brought in by brother complaining of elevated blood sugars at home.  Higher than normal.  Asymptomatic. Presently taking metformin 1000 mg twice a day, glipizide 5 mg twice a day, and Jardiance 10 mg daily. Difficult to control his diet. No other complaints or medical concerns today.  Diabetes Pertinent negatives for diabetes include no chest pain.    Prior to Admission medications   Medication Sig Start Date End Date Taking? Authorizing Provider  amLODipine (NORVASC) 10 MG tablet Take 1 tablet (10 mg total) by mouth daily. 06/08/20  Yes Ahley Bulls, Eilleen Kempf, MD  atorvastatin (LIPITOR) 40 MG tablet TAKE 1 TABLET BY MOUTH ONCE DAILY IN THE MORNING 01/04/21  Yes Ettie Krontz, Eilleen Kempf, MD  cholecalciferol (VITAMIN D3) 25 MCG (1000 UT) tablet Take 1 tablet (1,000 Units total) by mouth daily. 06/01/19  Yes Lezlie Lye, Meda Coffee, MD  empagliflozin (JARDIANCE) 25 MG TABS tablet Take 1 tablet (25 mg total) by mouth daily. 01/04/21  Yes Khiyan Crace, Eilleen Kempf, MD  glucose blood (ACCU-CHEK AVIVA PLUS) test strip USE 1 STRIP TO CHECK GLUCOSE TWICE DAILY 05/22/18  Yes Arnette Felts, FNP  Lancets (ACCU-CHEK SOFT TOUCH) lancets Use as instructed 04/02/18  Yes Lezlie Lye, Irma M, MD  LUMIGAN 0.01 % SOLN SMARTSIG:In Eye(s) 01/30/21  Yes  [provider]  metFORMIN (GLUCOPHAGE) 1000 MG tablet TAKE 1 TABLET BY MOUTH  TWICE DAILY WITH A MEAL 11/29/20  Yes Jaileigh Weimer, Eilleen Kempf, MD  olmesartan-hydrochlorothiazide (BENICAR HCT) 20-12.5 MG tablet Take 1 tablet by mouth daily. 02/03/21  Yes Lenah Messenger, Eilleen Kempf, MD  pantoprazole (PROTONIX) 40 MG tablet Take 1 tablet (40 mg total) by mouth daily. 01/06/21  Yes Yeraldine Forney, Eilleen Kempf, MD  triamcinolone cream (KENALOG) 0.1 % Apply 1 application topically 2 (two) times daily. 06/01/19  Yes Lezlie Lye, Irma M, MD  glipiZIDE (GLUCOTROL) 5 MG tablet Take 1 tablet (5 mg total) by mouth 2 (two) times daily before a meal. 09/08/20 12/07/20  Georgina Quint, MD    Allergies  Allergen Reactions   Penicillins Rash    Patient Active Problem List   Diagnosis Date Noted   Hyperlipidemia associated with type 2 diabetes mellitus (HCC) 09/08/2020   Dental disease 06/28/2016   Autism disorder 04/05/2014   Gastroesophageal reflux disease without esophagitis 04/05/2014   HTN (hypertension), benign 04/05/2014   Hyperlipidemia 04/05/2014   Type 2 diabetes mellitus without complication (HCC) 04/05/2014    Past Medical History:  Diagnosis Date   Autism 07-18-11   pt. moans and makes verbal sounds,doesn't talk   Chronic kidney disease 07-18-11   kidney stones   Diabetes mellitus     Past Surgical History:  Procedure Laterality Date  CYSTOSCOPY W/ URETERAL STENT PLACEMENT  07/12/2011   Procedure: CYSTOSCOPY WITH RETROGRADE PYELOGRAM/URETERAL STENT PLACEMENT;  Surgeon: Crecencio Mc, MD;  Location: [facility];  Service: Urology;  Laterality: Right;   CYSTOSCOPY W/ URETERAL STENT REMOVAL  08/06/2011   Procedure: CYSTOSCOPY WITH STENT REMOVAL;  Surgeon: Crecencio Mc, MD;  Location: [facility];  Service: Urology;  Laterality: Right;  Cystscopy with Right Ureteral Stent Removal    CYSTOSCOPY/RETROGRADE/URETEROSCOPY/STONE EXTRACTION WITH BASKET  07/19/2011   Procedure: CYSTOSCOPY/RETROGRADE/URETEROSCOPY/STONE  EXTRACTION WITH BASKET;  Surgeon: Crecencio Mc, MD;  Location: [facility];  Service: Urology;  Laterality: Right;   ingrown toenails      Social History   Socioeconomic History   Marital status: Single    Spouse name: Not on file   Number of children: Not on file   Years of education: Not on file   Highest education level: Not on file  Occupational History   Not on file  Tobacco Use   Smoking status: Never   Smokeless tobacco: Never  Substance and Sexual Activity   Alcohol use: No   Drug use: No   Sexual activity: Never  Other Topics Concern   Not on file  Social History Narrative   Not on file   Social Determinants of Health   Financial Resource Strain: Not on file  Food Insecurity: Not on file  Transportation Needs: Not on file  Physical Activity: Not on file  Stress: Not on file  Social Connections: Not on file  Intimate Partner Violence: Not on file    History reviewed. No pertinent family history.   Review of Systems  Constitutional: Negative.  Negative for chills and fever.  HENT:  Negative for congestion.   Respiratory:  Negative for cough and shortness of breath.   Cardiovascular:  Negative for chest pain.  Gastrointestinal:  Negative for diarrhea, nausea and vomiting.  Skin: Negative.  Negative for rash.  All other systems reviewed and are negative.  Today's Vitals   03/20/21 1443  BP: 138/70  Pulse: 84  SpO2: 95%  Weight: 237 lb (107.5 kg)  Height:  (1.6 m)   Body mass index is 41.98 kg/m.  Physical Exam Vitals reviewed.  Constitutional:      Appearance: Normal appearance. He is obese.  HENT:     Head: Normocephalic.  Eyes:     Extraocular Movements: Extraocular movements intact.     Pupils: Pupils are equal, round, and reactive to light.  Cardiovascular:     Rate and Rhythm: Normal rate.  Pulmonary:     Effort: Pulmonary effort is normal.  Skin:    General: Skin is warm and dry.  Neurological:     Mental Status: He is alert. Mental  status is at baseline.  Psychiatric:        Behavior: Behavior normal.    Results for orders placed or performed in visit on 03/20/21 (from the past 24 hour(s))  POCT glycosylated hemoglobin (Hb A1C)     Status: Abnormal   Collection Time: 03/20/21  2:55 PM  Result Value Ref Range   Hemoglobin A1C 9.2 (A) 4.0 - 5.6 %   HbA1c POC (<> result, manual entry)     HbA1c, POC (prediabetic range)     HbA1c, POC (controlled diabetic range)      ASSESSMENT & PLAN: Hyperlipidemia associated with type 2 diabetes mellitus (HCC) Uncontrolled diabetes with hemoglobin A1c higher than before at 9.2. Continue metformin 1000 mg twice a day, glipizide 5 mg twice a day and  Jardiance 10 mg daily Will start Trulicity 0.75 mg weekly or equivalent depending on medical insurance's formulary.  Demonstration pen used with brother who will be administering this medication. Diet and nutrition discussed again. Follow-up in 3 months. Continue atorvastatin 40 mg daily.  Hypertension associated with diabetes (HCC) Well-controlled hypertension.  Continue Benicar-HCTZ 20-12.5 mg and amlodipine 10 mg daily. Follow-up in 3 months.  Julian Reyes was seen today for diabetes.  Diagnoses and all orders for this visit:  Uncontrolled type 2 diabetes mellitus with hyperglycemia (HCC) -     Dulaglutide (TRULICITY) 0.75 MG/0.5ML SOPN; Inject 0.75 mg into the skin once a week.  Hyperlipidemia associated with type 2 diabetes mellitus (HCC) -     POCT glycosylated hemoglobin (Hb A1C)  Need for influenza vaccination -     Flu Vaccine QUAD 54mo+IM (Fluarix, Fluzone & Alfiuria Quad PF)  Hypertension associated with diabetes (HCC)  Patient Instructions  Continue metformin, Jardiance, and glipizide. Start Trulicity 0.75 mg weekly. Follow-up in 3 months. Diabetes Mellitus and Nutrition, Adult When you have diabetes, or diabetes mellitus, it is very important to have healthy eating habits because your blood sugar (glucose) levels  are greatly affected by what you eat and drink. Eating healthy foods in the right amounts, at about the same times every day, can help you: Control your blood glucose. Lower your risk of heart disease. Improve your blood pressure. Reach or maintain a healthy weight. What can affect my meal plan? Every person with diabetes is different, and each person has different needs for a meal plan. Your health care provider may recommend that you work with a dietitian to make a meal plan that is best for you. Your meal plan may vary depending on factors such as: The calories you need. The medicines you take. Your weight. Your blood glucose, blood pressure, and cholesterol levels. Your activity level. Other health conditions you have, such as heart or kidney disease. How do carbohydrates affect me? Carbohydrates, also called carbs, affect your blood glucose level more than any other type of food. Eating carbs naturally raises the amount of glucose in your blood. Carb counting is a method for keeping track of how many carbs you eat. Counting carbs is important to keep your blood glucose at a healthy level, especially if you use insulin or take certain oral diabetes medicines. It is important to know how many carbs you can safely have in each meal. This is different for every person. Your dietitian can help you calculate how many carbs you should have at each meal and for each snack. How does alcohol affect me? Alcohol can cause a sudden decrease in blood glucose (hypoglycemia), especially if you use insulin or take certain oral diabetes medicines. Hypoglycemia can be a life-threatening condition. Symptoms of hypoglycemia, such as sleepiness, dizziness, and confusion, are similar to symptoms of having too much alcohol. Do not drink alcohol if: Your health care provider tells you not to drink. You are pregnant, may be pregnant, or are planning to become pregnant. If you drink alcohol: Do not drink on an empty  stomach. Limit how much you use to: 0-1 drink a day for women. 0-2 drinks a day for men. Be aware of how much alcohol is in your drink. In the [place] one drink equals one 12 oz bottle of beer (355 mL), one 5 oz glass of wine (148 mL), or one 1 oz glass of hard liquor (44 mL). Keep yourself hydrated with water, diet soda, or unsweetened iced tea.  Keep in mind that regular soda, juice, and other mixers may contain a lot of sugar and must be counted as carbs. What are tips for following this plan? Reading food labels Start by checking the serving size on the "Nutrition Facts" label of packaged foods and drinks. The amount of calories, carbs, fats, and other nutrients listed on the label is based on one serving of the item. Many items contain more than one serving per package. Check the total grams (g) of carbs in one serving. You can calculate the number of servings of carbs in one serving by dividing the total carbs by 15. For example, if a food has 30 g of total carbs per serving, it would be equal to 2 servings of carbs. Check the number of grams (g) of saturated fats and trans fats in one serving. Choose foods that have a low amount or none of these fats. Check the number of milligrams (mg) of salt (sodium) in one serving. Most people should limit total sodium intake to less than 2,300 mg per day. Always check the nutrition information of foods labeled as "low-fat" or "nonfat." These foods may be higher in added sugar or refined carbs and should be avoided. Talk to your dietitian to identify your daily goals for nutrients listed on the label. Shopping Avoid buying canned, pre-made, or processed foods. These foods tend to be high in fat, sodium, and added sugar. Shop around the outside edge of the grocery store. This is where you will most often find fresh fruits and vegetables, bulk grains, fresh meats, and fresh dairy. Cooking Use low-heat cooking methods, such as baking, instead of high-heat  cooking methods like deep frying. Cook using healthy oils, such as olive, canola, or sunflower oil. Avoid cooking with butter, cream, or high-fat meats. Meal planning Eat meals and snacks regularly, preferably at the same times every day. Avoid going long periods of time without eating. Eat foods that are high in fiber, such as fresh fruits, vegetables, beans, and whole grains. Talk with your dietitian about how many servings of carbs you can eat at each meal. Eat 4-6 oz (112-168 g) of lean protein each day, such as lean meat, chicken, fish, eggs, or tofu. One ounce (oz) of lean protein is equal to: 1 oz (28 g) of meat, chicken, or fish. 1 egg.  cup (62 g) of tofu. Eat some foods each day that contain healthy fats, such as avocado, nuts, seeds, and fish. What foods should I eat? Fruits Berries. Apples. Oranges. Peaches. Apricots. Plums. Grapes. Mango. Papaya. Pomegranate. Kiwi. Cherries. Vegetables Lettuce. Spinach. Leafy greens, including kale, chard, collard greens, and mustard greens. Beets. Cauliflower. Cabbage. Broccoli. Carrots. Green beans. Tomatoes. Peppers. Onions. Cucumbers. Brussels sprouts. Grains Whole grains, such as whole-wheat or whole-grain bread, crackers, tortillas, cereal, and pasta. Unsweetened oatmeal. Quinoa. Brown or wild rice. Meats and other proteins Seafood. Poultry without skin. Lean cuts of poultry and beef. Tofu. Nuts. Seeds. Dairy Low-fat or fat-free dairy products such as milk, yogurt, and cheese. The items listed above may not be a complete list of foods and beverages you can eat. Contact a dietitian for more information. What foods should I avoid? Fruits Fruits canned with syrup. Vegetables Canned vegetables. Frozen vegetables with butter or cream sauce. Grains Refined white flour and flour products such as bread, pasta, snack foods, and cereals. Avoid all processed foods. Meats and other proteins Fatty cuts of meat. Poultry with skin. Breaded or  fried meats. Processed meat. Avoid saturated fats.  Dairy Full-fat yogurt, cheese, or milk. Beverages Sweetened drinks, such as soda or iced tea. The items listed above may not be a complete list of foods and beverages you should avoid. Contact a dietitian for more information. Questions to ask a health care provider Do I need to meet with a diabetes educator? Do I need to meet with a dietitian? What number can I call if I have questions? When are the best times to check my blood glucose? Where to find more information: American Diabetes Association: diabetes.org Academy of Nutrition and Dietetics: www.eatright.[employer]org National Institute of Diabetes and Digestive and Kidney Diseases: CarFlippers.tnwww.niddk.nih.gov Association of Diabetes Care and Education Specialists: www.diabeteseducator.org Summary It is important to have healthy eating habits because your blood sugar (glucose) levels are greatly affected by what you eat and drink. A healthy meal plan will help you control your blood glucose and maintain a healthy lifestyle. Your health care provider may recommend that you work with a dietitian to make a meal plan that is best for you. Keep in mind that carbohydrates (carbs) and alcohol have immediate effects on your blood glucose levels. It is important to count carbs and to use alcohol carefully. This information is not intended to replace advice given to you by your health care provider. Make sure you discuss any questions you have with your health care provider. Document Revised: 06/02/2019 Document Reviewed: 06/02/2019 Elsevier Patient Education  2021 Elsevier Inc.    Edwina BarthMiguel Ashlye Oviedo, MD [facility]

## 2021-03-20 NOTE — Patient Instructions (Addendum)
Continue metformin, Jardiance, and glipizide. Start Trulicity 0.75 mg weekly. Follow-up in 3 months. Diabetes Mellitus and Nutrition, Adult When you have diabetes, or diabetes mellitus, it is very important to have healthy eating habits because your blood sugar (glucose) levels are greatly affected by what you eat and drink. Eating healthy foods in the right amounts, at about the same times every day, can help you: Control your blood glucose. Lower your risk of heart disease. Improve your blood pressure. Reach or maintain a healthy weight. What can affect my meal plan? Every person with diabetes is different, and each person has different needs for a meal plan. Your health care provider may recommend that you work with a dietitian to make a meal plan that is best for you. Your meal plan may vary depending on factors such as: The calories you need. The medicines you take. Your weight. Your blood glucose, blood pressure, and cholesterol levels. Your activity level. Other health conditions you have, such as heart or kidney disease. How do carbohydrates affect me? Carbohydrates, also called carbs, affect your blood glucose level more than any other type of food. Eating carbs naturally raises the amount of glucose in your blood. Carb counting is a method for keeping track of how many carbs you eat. Counting carbs is important to keep your blood glucose at a healthy level, especially if you use insulin or take certain oral diabetes medicines. It is important to know how many carbs you can safely have in each meal. This is different for every person. Your dietitian can help you calculate how many carbs you should have at each meal and for each snack. How does alcohol affect me? Alcohol can cause a sudden decrease in blood glucose (hypoglycemia), especially if you use insulin or take certain oral diabetes medicines. Hypoglycemia can be a life-threatening condition. Symptoms of hypoglycemia, such as  sleepiness, dizziness, and confusion, are similar to symptoms of having too much alcohol. Do not drink alcohol if: Your health care provider tells you not to drink. You are pregnant, may be pregnant, or are planning to become pregnant. If you drink alcohol: Do not drink on an empty stomach. Limit how much you use to: 0-1 drink a day for women. 0-2 drinks a day for men. Be aware of how much alcohol is in your drink. In the [place] one drink equals one 12 oz bottle of beer (355 mL), one 5 oz glass of wine (148 mL), or one 1 oz glass of hard liquor (44 mL). Keep yourself hydrated with water, diet soda, or unsweetened iced tea. Keep in mind that regular soda, juice, and other mixers may contain a lot of sugar and must be counted as carbs. What are tips for following this plan? Reading food labels Start by checking the serving size on the "Nutrition Facts" label of packaged foods and drinks. The amount of calories, carbs, fats, and other nutrients listed on the label is based on one serving of the item. Many items contain more than one serving per package. Check the total grams (g) of carbs in one serving. You can calculate the number of servings of carbs in one serving by dividing the total carbs by 15. For example, if a food has 30 g of total carbs per serving, it would be equal to 2 servings of carbs. Check the number of grams (g) of saturated fats and trans fats in one serving. Choose foods that have a low amount or none of these fats. Check the  number of milligrams (mg) of salt (sodium) in one serving. Most people should limit total sodium intake to less than 2,300 mg per day. Always check the nutrition information of foods labeled as "low-fat" or "nonfat." These foods may be higher in added sugar or refined carbs and should be avoided. Talk to your dietitian to identify your daily goals for nutrients listed on the label. Shopping Avoid buying canned, pre-made, or processed foods. These foods  tend to be high in fat, sodium, and added sugar. Shop around the outside edge of the grocery store. This is where you will most often find fresh fruits and vegetables, bulk grains, fresh meats, and fresh dairy. Cooking Use low-heat cooking methods, such as baking, instead of high-heat cooking methods like deep frying. Cook using healthy oils, such as olive, canola, or sunflower oil. Avoid cooking with butter, cream, or high-fat meats. Meal planning Eat meals and snacks regularly, preferably at the same times every day. Avoid going long periods of time without eating. Eat foods that are high in fiber, such as fresh fruits, vegetables, beans, and whole grains. Talk with your dietitian about how many servings of carbs you can eat at each meal. Eat 4-6 oz (112-168 g) of lean protein each day, such as lean meat, chicken, fish, eggs, or tofu. One ounce (oz) of lean protein is equal to: 1 oz (28 g) of meat, chicken, or fish. 1 egg.  cup (62 g) of tofu. Eat some foods each day that contain healthy fats, such as avocado, nuts, seeds, and fish. What foods should I eat? Fruits Berries. Apples. Oranges. Peaches. Apricots. Plums. Grapes. Mango. Papaya. Pomegranate. Kiwi. Cherries. Vegetables Lettuce. Spinach. Leafy greens, including kale, chard, collard greens, and mustard greens. Beets. Cauliflower. Cabbage. Broccoli. Carrots. Green beans. Tomatoes. Peppers. Onions. Cucumbers. Brussels sprouts. Grains Whole grains, such as whole-wheat or whole-grain bread, crackers, tortillas, cereal, and pasta. Unsweetened oatmeal. Quinoa. Brown or wild rice. Meats and other proteins Seafood. Poultry without skin. Lean cuts of poultry and beef. Tofu. Nuts. Seeds. Dairy Low-fat or fat-free dairy products such as milk, yogurt, and cheese. The items listed above may not be a complete list of foods and beverages you can eat. Contact a dietitian for more information. What foods should I avoid? Fruits Fruits canned with  syrup. Vegetables Canned vegetables. Frozen vegetables with butter or cream sauce. Grains Refined white flour and flour products such as bread, pasta, snack foods, and cereals. Avoid all processed foods. Meats and other proteins Fatty cuts of meat. Poultry with skin. Breaded or fried meats. Processed meat. Avoid saturated fats. Dairy Full-fat yogurt, cheese, or milk. Beverages Sweetened drinks, such as soda or iced tea. The items listed above may not be a complete list of foods and beverages you should avoid. Contact a dietitian for more information. Questions to ask a health care provider Do I need to meet with a diabetes educator? Do I need to meet with a dietitian? What number can I call if I have questions? When are the best times to check my blood glucose? Where to find more information: American Diabetes Association: diabetes.org Academy of Nutrition and Dietetics: www.eatright.[employer] of Diabetes and Digestive and Kidney Diseases: CarFlippers.tn Association of Diabetes Care and Education Specialists: www.diabeteseducator.org Summary It is important to have healthy eating habits because your blood sugar (glucose) levels are greatly affected by what you eat and drink. A healthy meal plan will help you control your blood glucose and maintain a healthy lifestyle. Your health care provider  may recommend that you work with a dietitian to make a meal plan that is best for you. Keep in mind that carbohydrates (carbs) and alcohol have immediate effects on your blood glucose levels. It is important to count carbs and to use alcohol carefully. This information is not intended to replace advice given to you by your health care provider. Make sure you discuss any questions you have with your health care provider. Document Revised: 06/02/2019 Document Reviewed: 06/02/2019 Elsevier Patient Education  2021 [employer].

## 2021-03-29 ENCOUNTER — Other Ambulatory Visit: Payer: Self-pay | Admitting: Emergency Medicine

## 2021-04-03 ENCOUNTER — Telehealth: Payer: Self-pay | Admitting: Emergency Medicine

## 2021-04-03 MED ORDER — TRIAMCINOLONE ACETONIDE 0.1 % EX CREA: 1.0000 "application " | TOPICAL_CREAM | Freq: Two times a day (BID) | CUTANEOUS | 1 refills | Status: AC

## 2021-04-03 NOTE — Telephone Encounter (Signed)
1.Medication Requested: triamcinolone cream (KENALOG) 0.1 %  2. Pharmacy (Name, Street, [place]): [facility] [address] [place] [place] - [id number]  Phone:  [phone] Fax:  [phone]    3. On Med List: yes  4. Last Visit with PCP: 09.12.22  5. Next visit date with PCP: n/a  **Patient has rash under arm & needs refill on rx to clear it**   Agent: Please be advised that RX refills may take up to 3 business days. We ask that you follow-up with your pharmacy.

## 2021-04-03 NOTE — Telephone Encounter (Signed)
Refilled medication

## 2021-04-05 ENCOUNTER — Other Ambulatory Visit: Payer: Self-pay | Admitting: Emergency Medicine

## 2021-04-07 ENCOUNTER — Other Ambulatory Visit: Payer: Self-pay | Admitting: Emergency Medicine

## 2021-04-11 ENCOUNTER — Telehealth: Payer: Self-pay

## 2021-04-11 MED ORDER — AMLODIPINE BESYLATE 10 MG PO TABS: 10.0000 mg | ORAL_TABLET | Freq: Every day | ORAL | 1 refills | Status: DC

## 2021-04-11 NOTE — Telephone Encounter (Signed)
Medication refilled

## 2021-04-20 ENCOUNTER — Telehealth: Payer: Self-pay | Admitting: Emergency Medicine

## 2021-04-20 NOTE — Telephone Encounter (Signed)
1.Medication Requested: glucose blood (ACCU-CHEK AVIVA PLUS) test strip  2. Pharmacy (Name, Street, [place]):  [id number] -  (SE), [place] - [address] 3. On Med List:Yes  4. Last Visit with PCP: 03-20-2021  5. Next visit date with PCP: n/a   Agent: Please be advised that RX refills may take up to 3 business days. We ask that you follow-up with your pharmacy.

## 2021-04-24 MED ORDER — ACCU-CHEK AVIVA PLUS VI STRP: ORAL_STRIP | 11 refills | Status: DC

## 2021-04-24 NOTE — Telephone Encounter (Signed)
Refilled medication

## 2021-04-24 NOTE — Addendum Note (Signed)
Addended by: Nena Polio on: 04/24/2021 09:30 AM   Modules accepted: Orders

## 2021-04-28 ENCOUNTER — Telehealth: Payer: Self-pay | Admitting: Emergency Medicine

## 2021-04-28 NOTE — Telephone Encounter (Signed)
Patient says he needs Accu-Chek Guide test strips for his meter bc the Accu-Check Aviva Plus test strips are not compatiable w/ his meter  Please send new rx for Accu-Ceck Guide test strips:  [facility] [address] ([address]), [place] - [address] Phone:  [phone]  Fax:  [phone]

## 2021-05-01 MED ORDER — ACCU-CHEK GUIDE VI STRP: 1.0000 | ORAL_STRIP | 1 refills | Status: DC | PRN

## 2021-05-01 NOTE — Telephone Encounter (Signed)
Sent in new prescription for Accu-chek guide test strips per pt request to [facility].

## 2021-05-08 ENCOUNTER — Other Ambulatory Visit: Payer: Self-pay | Admitting: Emergency Medicine

## 2021-05-08 DIAGNOSIS — K219 Gastro-esophageal reflux disease without esophagitis: Secondary | ICD-10-CM

## 2021-05-12 NOTE — Telephone Encounter (Signed)
Called and spoke with [facility] pharmacy, they state that pt ACCU CHEK Avia strips were picked up 04/24/21. Called pt and spoke with him, he states that they where the wrong test strips and requested ACCU CHEK Guide strips. The pharmacy states that the ACCU CHEK Guide strips were transferred out to [facility]. Called and spoke to pt about this, he states he will wait for [facility] to deliver test strips.

## 2021-05-12 NOTE — Telephone Encounter (Signed)
Patient calling in  [facility] & they said they did not have it (see prev message below)  [facility] so patient can pick up [phone]

## 2021-06-06 ENCOUNTER — Other Ambulatory Visit: Payer: Self-pay | Admitting: Emergency Medicine

## 2021-06-06 DIAGNOSIS — E785 Hyperlipidemia, unspecified: Secondary | ICD-10-CM

## 2021-06-06 DIAGNOSIS — E1165 Type 2 diabetes mellitus with hyperglycemia: Secondary | ICD-10-CM

## 2021-06-06 DIAGNOSIS — E1169 Type 2 diabetes mellitus with other specified complication: Secondary | ICD-10-CM

## 2021-06-29 ENCOUNTER — Other Ambulatory Visit: Payer: Self-pay | Admitting: Emergency Medicine

## 2021-06-29 DIAGNOSIS — I10 Essential (primary) hypertension: Secondary | ICD-10-CM

## 2021-10-12 LAB — HM DIABETES EYE EXAM

## 2021-11-07 ENCOUNTER — Other Ambulatory Visit: Payer: Self-pay | Admitting: Emergency Medicine

## 2021-11-07 DIAGNOSIS — E1169 Type 2 diabetes mellitus with other specified complication: Secondary | ICD-10-CM

## 2021-12-13 ENCOUNTER — Encounter: Payer: Self-pay | Admitting: Emergency Medicine

## 2021-12-13 ENCOUNTER — Ambulatory Visit (INDEPENDENT_AMBULATORY_CARE_PROVIDER_SITE_OTHER): Payer: Medicare Other | Admitting: Emergency Medicine

## 2021-12-13 VITALS — BP 108/72 | HR 93 | Temp 98.8°F | Ht 63.0 in | Wt 215.5 lb

## 2021-12-13 DIAGNOSIS — E1169 Type 2 diabetes mellitus with other specified complication: Secondary | ICD-10-CM

## 2021-12-13 DIAGNOSIS — E1165 Type 2 diabetes mellitus with hyperglycemia: Secondary | ICD-10-CM | POA: Diagnosis not present

## 2021-12-13 DIAGNOSIS — E1159 Type 2 diabetes mellitus with other circulatory complications: Secondary | ICD-10-CM

## 2021-12-13 DIAGNOSIS — E785 Hyperlipidemia, unspecified: Secondary | ICD-10-CM

## 2021-12-13 DIAGNOSIS — K219 Gastro-esophageal reflux disease without esophagitis: Secondary | ICD-10-CM

## 2021-12-13 DIAGNOSIS — I152 Hypertension secondary to endocrine disorders: Secondary | ICD-10-CM | POA: Diagnosis not present

## 2021-12-13 DIAGNOSIS — N1831 Chronic kidney disease, stage 3a: Secondary | ICD-10-CM

## 2021-12-13 DIAGNOSIS — F840 Autistic disorder: Secondary | ICD-10-CM

## 2021-12-13 LAB — CBC WITH DIFFERENTIAL/PLATELET
Basophils Absolute: 0.1 10*3/uL (ref 0.0–0.1)
Basophils Relative: 0.9 % (ref 0.0–3.0)
Eosinophils Absolute: 0.3 10*3/uL (ref 0.0–0.7)
Eosinophils Relative: 2.7 % (ref 0.0–5.0)
HCT: 44.1 % (ref 39.0–52.0)
Hemoglobin: 14.5 g/dL (ref 13.0–17.0)
Lymphocytes Relative: 18.8 % (ref 12.0–46.0)
Lymphs Abs: 2.2 10*3/uL (ref 0.7–4.0)
MCHC: 32.8 g/dL (ref 30.0–36.0)
MCV: 86.8 fl (ref 78.0–100.0)
Monocytes Absolute: 0.5 10*3/uL (ref 0.1–1.0)
Monocytes Relative: 4.1 % (ref 3.0–12.0)
Neutro Abs: 8.5 10*3/uL — ABNORMAL HIGH (ref 1.4–7.7)
Neutrophils Relative %: 73.5 % (ref 43.0–77.0)
Platelets: 324 10*3/uL (ref 150.0–400.0)
RBC: 5.08 Mil/uL (ref 4.22–5.81)
RDW: 15 % (ref 11.5–15.5)
WBC: 11.6 10*3/uL — ABNORMAL HIGH (ref 4.0–10.5)

## 2021-12-13 LAB — COMPREHENSIVE METABOLIC PANEL
ALT: 26 U/L (ref 0–53)
AST: 17 U/L (ref 0–37)
Albumin: 4.4 g/dL (ref 3.5–5.2)
Alkaline Phosphatase: 97 U/L (ref 39–117)
BUN: 28 mg/dL — ABNORMAL HIGH (ref 6–23)
CO2: 23 mEq/L (ref 19–32)
Calcium: 9.7 mg/dL (ref 8.4–10.5)
Chloride: 104 mEq/L (ref 96–112)
Creatinine, Ser: 1.48 mg/dL (ref 0.40–1.50)
GFR: 50.27 mL/min — ABNORMAL LOW (ref >60.00)
Glucose, Bld: 157 mg/dL — ABNORMAL HIGH (ref 70–99)
Potassium: 4.1 mEq/L (ref 3.5–5.1)
Sodium: 139 mEq/L (ref 135–145)
Total Bilirubin: 0.4 mg/dL (ref 0.2–1.2)
Total Protein: 7.4 g/dL (ref 6.0–8.3)

## 2021-12-13 LAB — LIPID PANEL
Cholesterol: 161 mg/dL (ref 0–200)
HDL: 30.9 mg/dL — ABNORMAL LOW (ref >39.00)
NonHDL: 129.63
Total CHOL/HDL Ratio: 5
Triglycerides: 245 mg/dL — ABNORMAL HIGH (ref 0.0–149.0)
VLDL: 49 mg/dL — ABNORMAL HIGH (ref 0.0–40.0)

## 2021-12-13 LAB — POCT GLYCOSYLATED HEMOGLOBIN (HGB A1C): Hemoglobin A1C: 8.7 % — AB (ref 4.0–5.6)

## 2021-12-13 LAB — LDL CHOLESTEROL, DIRECT: Direct LDL: 107 mg/dL

## 2021-12-13 LAB — MICROALBUMIN / CREATININE URINE RATIO
Creatinine,U: 51.3 mg/dL
Microalb Creat Ratio: 1.4 mg/g (ref 0.0–30.0)
Microalb, Ur: <0.7 mg/dL (ref 0.0–1.9)

## 2021-12-13 MED ORDER — HYDROXYZINE HCL 25 MG PO TABS: 25.0000 mg | ORAL_TABLET | Freq: Every evening | ORAL | 1 refills | Status: AC | PRN

## 2021-12-13 MED ORDER — TRULICITY 0.75 MG/0.5ML ~~LOC~~ SOAJ: 0.7500 mg | SUBCUTANEOUS | 5 refills | Status: DC

## 2021-12-13 NOTE — Assessment & Plan Note (Signed)
Stable.  Diet and nutrition discussed. Continue atorvastatin 40 mg daily. 

## 2021-12-13 NOTE — Progress Notes (Signed)
Julian Reyes 61 y.o.   Chief Complaint  Patient presents with   Follow-up    HISTORY OF PRESENT ILLNESS: This is a 61 y.o. male autistic patient here with his brother for follow-up of chronic medical problems. #1 diabetes: On metformin, glipizide, and Jardiance.  Not taking Trulicity as discussed during last visit #2 hypertension: On amlodipine and olmesartan-HCTZ #3 dyslipidemia: On atorvastatin #4 reflux disease: On pantoprazole 40 mg. #5 history of autism.  HPI   Prior to Admission medications   Medication Sig Start Date End Date Taking? Authorizing Provider  amLODipine (NORVASC) 10 MG tablet TAKE 1 TABLET BY MOUTH  DAILY 06/29/21  Yes Kamariyah Timberlake, Eilleen Kempf, MD  atorvastatin (LIPITOR) 40 MG tablet TAKE 1 TABLET BY MOUTH ONCE DAILY IN THE MORNING 11/07/21  Yes Babara Buffalo, Eilleen Kempf, MD  cholecalciferol (VITAMIN D3) 25 MCG (1000 UT) tablet Take 1 tablet (1,000 Units total) by mouth daily. 06/01/19  Yes Lezlie Lye, Irma M, MD  glipiZIDE (GLUCOTROL) 5 MG tablet TAKE 1 TABLET BY MOUTH  TWICE DAILY BEFORE MEALS 06/06/21  Yes Cincere Deprey, Eilleen Kempf, MD  glucose blood (ACCU-CHEK GUIDE) test strip 1 each by Other route as needed for other. Use as instructed 05/01/21  Yes Haidar Muse, Eilleen Kempf, MD  JARDIANCE 25 MG TABS tablet TAKE 1 TABLET BY MOUTH  DAILY 06/06/21  Yes Deleon Passe, [place], MD  Lancets (ACCU-CHEK SOFT TOUCH) lancets Use as instructed 04/02/18  Yes Lezlie Lye, Irma M, MD  LUMIGAN 0.01 % SOLN SMARTSIG:In Eye(s) 01/30/21  Yes [provider]  metFORMIN (GLUCOPHAGE) 1000 MG tablet TAKE 1 TABLET BY MOUTH  TWICE DAILY WITH MEALS 11/07/21  Yes Georgina Quint, MD  olmesartan-hydrochlorothiazide (BENICAR HCT) 20-12.5 MG tablet TAKE 1 TABLET BY MOUTH  DAILY 06/29/21  Yes Georgina Quint, MD  pantoprazole (PROTONIX) 40 MG tablet TAKE 1 TABLET BY MOUTH  DAILY 05/08/21  Yes Autie Vasudevan, Eilleen Kempf, MD  triamcinolone cream (KENALOG) 0.1 % Apply 1 application  topically 2 (two) times daily. 04/03/21  Yes Alithea Lapage, Eilleen Kempf, MD  Dulaglutide (TRULICITY) 0.75 MG/0.5ML SOPN Inject 0.75 mg into the skin once a week. 03/20/21   Georgina Quint, MD    Allergies  Allergen Reactions   Penicillins Rash    Patient Active Problem List   Diagnosis Date Noted   Stage 3a chronic kidney disease (HCC) 12/13/2021   Hyperlipidemia associated with type 2 diabetes mellitus (HCC) 09/08/2020   Dental disease 06/28/2016   Autism disorder 04/05/2014   Gastroesophageal reflux disease without esophagitis 04/05/2014   Hypertension associated with diabetes (HCC) 04/05/2014   Hyperlipidemia 04/05/2014   Type 2 diabetes mellitus without complication (HCC) 04/05/2014    Past Medical History:  Diagnosis Date   Autism 07-18-11   pt. moans and makes verbal sounds,doesn't talk   Chronic kidney disease 07-18-11   kidney stones   Diabetes mellitus     Past Surgical History:  Procedure Laterality Date   CYSTOSCOPY W/ URETERAL STENT PLACEMENT  07/12/2011   Procedure: CYSTOSCOPY WITH RETROGRADE PYELOGRAM/URETERAL STENT PLACEMENT;  Surgeon: Crecencio Mc, MD;  Location: [facility];  Service: Urology;  Laterality: Right;   CYSTOSCOPY W/ URETERAL STENT REMOVAL  08/06/2011   Procedure: CYSTOSCOPY WITH STENT REMOVAL;  Surgeon: Crecencio Mc, MD;  Location: [facility];  Service: Urology;  Laterality: Right;  Cystscopy with Right Ureteral Stent Removal    CYSTOSCOPY/RETROGRADE/URETEROSCOPY/STONE EXTRACTION WITH BASKET  07/19/2011   Procedure: CYSTOSCOPY/RETROGRADE/URETEROSCOPY/STONE EXTRACTION WITH BASKET;  Surgeon: Crecencio Mc, MD;  Location: [facility];  Service: Urology;  Laterality: Right;   ingrown toenails      Social History   Socioeconomic History   Marital status: Single    Spouse name: Not on file   Number of children: Not on file   Years of education: Not on file   Highest education level: Not on file  Occupational History   Not on file  Tobacco Use   Smoking status: Never    Smokeless tobacco: Never  Substance and Sexual Activity   Alcohol use: No   Drug use: No   Sexual activity: Never  Other Topics Concern   Not on file  Social History Narrative   Not on file   Social Determinants of Health   Financial Resource Strain: Not on file  Food Insecurity: Not on file  Transportation Needs: Not on file  Physical Activity: Not on file  Stress: Not on file  Social Connections: Not on file  Intimate Partner Violence: Not on file    No family history on file.   Review of Systems  Constitutional: Negative.  Negative for chills and fever.  HENT: Negative.  Negative for congestion and sore throat.   Respiratory: Negative.  Negative for cough and shortness of breath.   Cardiovascular: Negative.  Negative for chest pain and palpitations.  Gastrointestinal:  Negative for abdominal pain, diarrhea, nausea and vomiting.  Genitourinary: Negative.   Skin: Negative.  Negative for rash.  Neurological:  Negative for dizziness and headaches.  All other systems reviewed and are negative.  Today's Vitals   12/13/21 0933  BP: 108/72  Pulse: 93  Temp: 98.8 F (37.1 C)  TempSrc: Oral  SpO2: 96%  Weight: 215 lb 8 oz (97.8 kg)  Height: 5\' 3"  (1.6 m)   Body mass index is 38.17 kg/m.  Physical Exam Vitals reviewed.  Constitutional:      Appearance: Normal appearance. He is obese.  HENT:     Head: Normocephalic.     Mouth/Throat:     Mouth: Mucous membranes are moist.     Pharynx: Oropharynx is clear.  Eyes:     Extraocular Movements: Extraocular movements intact.     Pupils: Pupils are equal, round, and reactive to light.  Cardiovascular:     Rate and Rhythm: Normal rate and regular rhythm.     Pulses: Normal pulses.     Heart sounds: Normal heart sounds.  Pulmonary:     Effort: Pulmonary effort is normal.     Breath sounds: Normal breath sounds.  Abdominal:     Palpations: Abdomen is soft.     Tenderness: There is no abdominal tenderness.   Musculoskeletal:     Cervical back: No tenderness.  Lymphadenopathy:     Cervical: No cervical adenopathy.  Skin:    General: Skin is warm and dry.  Neurological:     General: No focal deficit present.     Mental Status: He is alert and oriented to person, place, and time.    Results for orders placed or performed in visit on 12/13/21 (from the past 24 hour(s))  POCT HgB A1C     Status: Abnormal   Collection Time: 12/13/21 10:09 AM  Result Value Ref Range   Hemoglobin A1C 8.7 (A) 4.0 - 5.6 %   HbA1c POC (<> result, manual entry)     HbA1c, POC (prediabetic range)     HbA1c, POC (controlled diabetic range)      ASSESSMENT & PLAN: A total of 55 minutes was spent with the  patient and counseling/coordination of care regarding preparing for this visit, review of most recent office visit notes, review of most recent blood work results including today's hemoglobin A1c, review of multiple chronic medical problems and their management, review of all medications and changes made, education on nutrition, prognosis, documentation and need for follow-up.  Problem List Items Addressed This Visit       Cardiovascular and Mediastinum   Hypertension associated with diabetes (HCC) - Primary    Well-controlled hypertension. BP Readings from Last 3 Encounters:  12/13/21 108/72  03/20/21 138/70  11/17/20 128/86  Continue olmesartan-HCTZ 20-12.5 mg daily and amlodipine 10 mg daily. Uncontrolled diabetes with hemoglobin A1c at 8.7. Lab Results  Component Value Date   HGBA1C 8.7 (A) 12/13/2021  Did not start Trulicity as prescribed during last office visit. Continue metformin 1000 mg twice a day, glipizide 5 mg daily and Jardiance 25 mg daily.  Start Trulicity 0.75 mg weekly. Diet and nutrition discussed. Follow-up in 3 months.        Relevant Medications   Dulaglutide (TRULICITY) 0.75 MG/0.5ML SOPN   Other Relevant Orders   CBC with Differential/Platelet   Comprehensive metabolic panel    Lipid panel   Urine Microalbumin w/creat. ratio     Digestive   Gastroesophageal reflux disease without esophagitis    Stable.  Continue pantoprazole 40 mg daily.         Endocrine   Type 2 diabetes mellitus without complication (HCC)   Relevant Medications   Dulaglutide (TRULICITY) 0.75 MG/0.5ML SOPN   Hyperlipidemia associated with type 2 diabetes mellitus (HCC)    Stable.  Diet and nutrition discussed.  Continue atorvastatin 40 mg daily.       Relevant Medications   Dulaglutide (TRULICITY) 0.75 MG/0.5ML SOPN     Genitourinary   Stage 3a chronic kidney disease (HCC)    Advised to stay well-hydrated and avoid NSAIDs         Other   Autism disorder    Stable.       Other Visit Diagnoses     Uncontrolled type 2 diabetes mellitus with hyperglycemia (HCC)       Relevant Medications   Dulaglutide (TRULICITY) 0.75 MG/0.5ML SOPN      Patient Instructions  Diabetes Mellitus and Nutrition, Adult When you have diabetes, or diabetes mellitus, it is very important to have healthy eating habits because your blood sugar (glucose) levels are greatly affected by what you eat and drink. Eating healthy foods in the right amounts, at about the same times every day, can help you: Manage your blood glucose. Lower your risk of heart disease. Improve your blood pressure. Reach or maintain a healthy weight. What can affect my meal plan? Every person with diabetes is different, and each person has different needs for a meal plan. Your health care provider may recommend that you work with a dietitian to make a meal plan that is best for you. Your meal plan may vary depending on factors such as: The calories you need. The medicines you take. Your weight. Your blood glucose, blood pressure, and cholesterol levels. Your activity level. Other health conditions you have, such as heart or kidney disease. How do carbohydrates affect me? Carbohydrates, also called carbs, affect your  blood glucose level more than any other type of food. Eating carbs raises the amount of glucose in your blood. It is important to know how many carbs you can safely have in each meal. This is different for every person. Your dietitian  can help you calculate how many carbs you should have at each meal and for each snack. How does alcohol affect me? Alcohol can cause a decrease in blood glucose (hypoglycemia), especially if you use insulin or take certain diabetes medicines by mouth. Hypoglycemia can be a life-threatening condition. Symptoms of hypoglycemia, such as sleepiness, dizziness, and confusion, are similar to symptoms of having too much alcohol. Do not drink alcohol if: Your health care provider tells you not to drink. You are pregnant, may be pregnant, or are planning to become pregnant. If you drink alcohol: Limit how much you have to: 0-1 drink a day for women. 0-2 drinks a day for men. Know how much alcohol is in your drink. In the [place] one drink equals one 12 oz bottle of beer (355 mL), one 5 oz glass of wine (148 mL), or one 1 oz glass of hard liquor (44 mL). Keep yourself hydrated with water, diet soda, or unsweetened iced tea. Keep in mind that regular soda, juice, and other mixers may contain a lot of sugar and must be counted as carbs. What are tips for following this plan?  Reading food labels Start by checking the serving size on the Nutrition Facts label of packaged foods and drinks. The number of calories and the amount of carbs, fats, and other nutrients listed on the label are based on one serving of the item. Many items contain more than one serving per package. Check the total grams (g) of carbs in one serving. Check the number of grams of saturated fats and trans fats in one serving. Choose foods that have a low amount or none of these fats. Check the number of milligrams (mg) of salt (sodium) in one serving. Most people should limit total sodium intake to less than  2,300 mg per day. Always check the nutrition information of foods labeled as "low-fat" or "nonfat." These foods may be higher in added sugar or refined carbs and should be avoided. Talk to your dietitian to identify your daily goals for nutrients listed on the label. Shopping Avoid buying canned, pre-made, or processed foods. These foods tend to be high in fat, sodium, and added sugar. Shop around the outside edge of the grocery store. This is where you will most often find fresh fruits and vegetables, bulk grains, fresh meats, and fresh dairy products. Cooking Use low-heat cooking methods, such as baking, instead of high-heat cooking methods, such as deep frying. Cook using healthy oils, such as olive, canola, or sunflower oil. Avoid cooking with butter, cream, or high-fat meats. Meal planning Eat meals and snacks regularly, preferably at the same times every day. Avoid going long periods of time without eating. Eat foods that are high in fiber, such as fresh fruits, vegetables, beans, and whole grains. Eat 4-6 oz (112-168 g) of lean protein each day, such as lean meat, chicken, fish, eggs, or tofu. One ounce (oz) (28 g) of lean protein is equal to: 1 oz (28 g) of meat, chicken, or fish. 1 egg.  cup (62 g) of tofu. Eat some foods each day that contain healthy fats, such as avocado, nuts, seeds, and fish. What foods should I eat? Fruits Berries. Apples. Oranges. Peaches. Apricots. Plums. Grapes. Mangoes. Papayas. Pomegranates. Kiwi. Cherries. Vegetables Leafy greens, including lettuce, spinach, kale, chard, collard greens, mustard greens, and cabbage. Beets. Cauliflower. Broccoli. Carrots. Green beans. Tomatoes. Peppers. Onions. Cucumbers. Brussels sprouts. Grains Whole grains, such as whole-wheat or whole-grain bread, crackers, tortillas, cereal, and pasta. Unsweetened oatmeal.  Quinoa. Brown or wild rice. Meats and other proteins Seafood. Poultry without skin. Lean cuts of poultry and  beef. Tofu. Nuts. Seeds. Dairy Low-fat or fat-free dairy products such as milk, yogurt, and cheese. The items listed above may not be a complete list of foods and beverages you can eat and drink. Contact a dietitian for more information. What foods should I avoid? Fruits Fruits canned with syrup. Vegetables Canned vegetables. Frozen vegetables with butter or cream sauce. Grains Refined white flour and flour products such as bread, pasta, snack foods, and cereals. Avoid all processed foods. Meats and other proteins Fatty cuts of meat. Poultry with skin. Breaded or fried meats. Processed meat. Avoid saturated fats. Dairy Full-fat yogurt, cheese, or milk. Beverages Sweetened drinks, such as soda or iced tea. The items listed above may not be a complete list of foods and beverages you should avoid. Contact a dietitian for more information. Questions to ask a health care provider Do I need to meet with a certified diabetes care and education specialist? Do I need to meet with a dietitian? What number can I call if I have questions? When are the best times to check my blood glucose? Where to find more information: American Diabetes Association: diabetes.org Academy of Nutrition and Dietetics: eatright.[employer]ana Corporationorg National Institute of Diabetes and Digestive and Kidney Diseases: StageSync.siniddk.nih.gov Association of Diabetes Care & Education Specialists: diabeteseducator.org Summary It is important to have healthy eating habits because your blood sugar (glucose) levels are greatly affected by what you eat and drink. It is important to use alcohol carefully. A healthy meal plan will help you manage your blood glucose and lower your risk of heart disease. Your health care provider may recommend that you work with a dietitian to make a meal plan that is best for you. This information is not intended to replace advice given to you by your health care provider. Make sure you discuss any questions you have with  your health care provider. Document Revised: 01/27/2020 Document Reviewed: 01/27/2020 Elsevier Patient Education  2023 Elsevier Inc.     Edwina BarthMiguel Oral Remache, MD [facility]

## 2021-12-13 NOTE — Patient Instructions (Signed)

## 2021-12-13 NOTE — Addendum Note (Signed)
Addended by: Evie Lacks on: 12/13/2021 05:18 PM   Modules accepted: Orders

## 2021-12-13 NOTE — Assessment & Plan Note (Signed)
Advised to stay well-hydrated and avoid NSAIDs. ?

## 2021-12-13 NOTE — Assessment & Plan Note (Signed)
Stable. Continue pantoprazole 40mg daily

## 2021-12-13 NOTE — Assessment & Plan Note (Signed)
Well-controlled hypertension. BP Readings from Last 3 Encounters:  12/13/21 108/72  03/20/21 138/70  11/17/20 128/86  Continue olmesartan-HCTZ 20-12.5 mg daily and amlodipine 10 mg daily. Uncontrolled diabetes with hemoglobin A1c at 8.7. Lab Results  Component Value Date   HGBA1C 8.7 (A) 12/13/2021  Did not start Trulicity as prescribed during last office visit. Continue metformin 1000 mg twice a day, glipizide 5 mg daily and Jardiance 25 mg daily.  Start Trulicity 0.75 mg weekly. Diet and nutrition discussed. Follow-up in 3 months.

## 2021-12-13 NOTE — Assessment & Plan Note (Signed)
Stable

## 2022-02-18 ENCOUNTER — Other Ambulatory Visit: Payer: Self-pay | Admitting: Emergency Medicine

## 2022-02-18 DIAGNOSIS — K219 Gastro-esophageal reflux disease without esophagitis: Secondary | ICD-10-CM

## 2022-02-18 DIAGNOSIS — E1165 Type 2 diabetes mellitus with hyperglycemia: Secondary | ICD-10-CM

## 2022-02-18 DIAGNOSIS — E1169 Type 2 diabetes mellitus with other specified complication: Secondary | ICD-10-CM

## 2022-03-15 ENCOUNTER — Ambulatory Visit: Payer: Medicare Other | Admitting: Emergency Medicine

## 2022-04-13 NOTE — Telephone Encounter (Signed)
error 

## 2022-05-04 ENCOUNTER — Other Ambulatory Visit: Payer: Self-pay | Admitting: Emergency Medicine

## 2022-05-04 DIAGNOSIS — E1169 Type 2 diabetes mellitus with other specified complication: Secondary | ICD-10-CM

## 2022-05-04 DIAGNOSIS — E1165 Type 2 diabetes mellitus with hyperglycemia: Secondary | ICD-10-CM

## 2022-05-25 ENCOUNTER — Other Ambulatory Visit: Payer: Self-pay | Admitting: Emergency Medicine

## 2022-05-25 ENCOUNTER — Telehealth: Payer: Self-pay | Admitting: Emergency Medicine

## 2022-05-25 DIAGNOSIS — E1165 Type 2 diabetes mellitus with hyperglycemia: Secondary | ICD-10-CM

## 2022-05-25 NOTE — Telephone Encounter (Signed)
Medication has been sent to patient requested pharmacy.  

## 2022-05-25 NOTE — Telephone Encounter (Signed)
Caller & Relationship to patient: Brother  Call back number: [phone]  Date of last office visit: 12/13/2021  Date of next office visit: N/A  Medication(s) to be refilled:  Dulaglutide (TRULICITY) 0.75 MG/0.5ML SOPN       Preferred Pharmacy:   [facility] [id number] - [place], [place] - [address] AT [facility] [address] & [facility]

## 2022-06-02 ENCOUNTER — Other Ambulatory Visit: Payer: Self-pay | Admitting: Emergency Medicine

## 2022-06-02 DIAGNOSIS — I10 Essential (primary) hypertension: Secondary | ICD-10-CM

## 2022-06-13 ENCOUNTER — Other Ambulatory Visit: Payer: Self-pay | Admitting: Emergency Medicine

## 2022-06-13 DIAGNOSIS — E1169 Type 2 diabetes mellitus with other specified complication: Secondary | ICD-10-CM

## 2022-07-25 ENCOUNTER — Other Ambulatory Visit: Payer: Self-pay | Admitting: Emergency Medicine

## 2022-07-25 DIAGNOSIS — I10 Essential (primary) hypertension: Secondary | ICD-10-CM

## 2022-07-30 ENCOUNTER — Telehealth: Payer: Self-pay | Admitting: Emergency Medicine

## 2022-07-30 DIAGNOSIS — E1165 Type 2 diabetes mellitus with hyperglycemia: Secondary | ICD-10-CM

## 2022-07-30 DIAGNOSIS — K219 Gastro-esophageal reflux disease without esophagitis: Secondary | ICD-10-CM

## 2022-07-30 MED ORDER — GLIPIZIDE 5 MG PO TABS: 5.0000 mg | ORAL_TABLET | Freq: Two times a day (BID) | ORAL | 3 refills | Status: AC

## 2022-07-30 MED ORDER — PANTOPRAZOLE SODIUM 40 MG PO TBEC: 40.0000 mg | DELAYED_RELEASE_TABLET | Freq: Every day | ORAL | 3 refills | Status: AC

## 2022-07-30 MED ORDER — TRULICITY 0.75 MG/0.5ML ~~LOC~~ SOAJ: SUBCUTANEOUS | 5 refills | Status: AC

## 2022-07-30 MED ORDER — AMLODIPINE BESYLATE 10 MG PO TABS: 10.0000 mg | ORAL_TABLET | Freq: Every day | ORAL | 3 refills | Status: AC

## 2022-07-30 MED ORDER — ACCU-CHEK GUIDE VI STRP: 1.0000 | ORAL_STRIP | 1 refills | Status: DC | PRN

## 2022-07-30 NOTE — Telephone Encounter (Signed)
Patients brother who is patients caretaker called and is trying to get everything done for his brother since he lives with him now and he needs a call back   MEDICATION:  amLODipine (NORVASC) 10 MG tablet  atorvastatin (LIPITOR) 40 MG tablet  glucose blood (ACCU-CHEK GUIDE) test strip  glipiZIDE (GLUCOTROL) 5 MG tablet  empagliflozin (JARDIANCE) 25 MG TABS tablet  LUMIGAN 0.01 % SOLN  metFORMIN (GLUCOPHAGE) 1000 MG tablet  pantoprazole (PROTONIX) 40 MG tablet  triamcinolone cream (KENALOG) 0.1 %  TRULICITY 7.85 YI/5.0YD SOPN  cholecalciferol (VITAMIN D3) 25 MCG (1000 UT) tablet   PHARMACY:  [facility], [place] [address] [place], [place] [address]    Comments:   **Let patient know to contact pharmacy at the end of the day to make sure medication is ready. **  ** Please notify patient to allow 48-72 hours to process**  **Encourage patient to contact the pharmacy for refills or they can request refills through [facility]**

## 2022-07-30 NOTE — Telephone Encounter (Signed)
Patients brother Elta Guadel[place] called and said he needs to speak to the nurse, He would like a call back at [phone]

## 2022-07-30 NOTE — Telephone Encounter (Signed)
Sent in requested medications

## 2022-09-01 ENCOUNTER — Other Ambulatory Visit: Payer: Self-pay | Admitting: Emergency Medicine

## 2022-09-01 DIAGNOSIS — E1169 Type 2 diabetes mellitus with other specified complication: Secondary | ICD-10-CM

## 2022-09-02 ENCOUNTER — Other Ambulatory Visit: Payer: Self-pay | Admitting: Emergency Medicine

## 2023-05-07 ENCOUNTER — Other Ambulatory Visit: Payer: Self-pay | Admitting: Emergency Medicine

## 2023-05-28 ENCOUNTER — Other Ambulatory Visit: Payer: Self-pay | Admitting: Emergency Medicine

## 2023-06-24 ENCOUNTER — Other Ambulatory Visit: Payer: Self-pay | Admitting: Emergency Medicine

## 2023-10-12 ENCOUNTER — Other Ambulatory Visit: Payer: Self-pay | Admitting: Emergency Medicine

## 2023-12-17 ENCOUNTER — Other Ambulatory Visit: Payer: Self-pay | Admitting: Emergency Medicine
